# Patient Record
Sex: Female | Born: 1970 | Hispanic: Yes | Marital: Single | Smoking: Never smoker
Health system: Southern US, Community
[De-identification: ages and names within clinical notes are randomized; demographics above are authoritative.]

## PROBLEM LIST (undated history)

## (undated) DIAGNOSIS — E119 Type 2 diabetes mellitus without complications: Secondary | ICD-10-CM

## (undated) DIAGNOSIS — K219 Gastro-esophageal reflux disease without esophagitis: Secondary | ICD-10-CM

---

## 2005-05-05 ENCOUNTER — Emergency Department: Payer: Self-pay | Admitting: Emergency Medicine

## 2005-06-26 ENCOUNTER — Inpatient Hospital Stay: Payer: Self-pay | Admitting: Unknown Physician Specialty

## 2009-01-01 ENCOUNTER — Emergency Department: Payer: Self-pay | Admitting: Internal Medicine

## 2009-01-29 ENCOUNTER — Emergency Department: Payer: Self-pay | Admitting: Emergency Medicine

## 2009-03-28 ENCOUNTER — Emergency Department: Payer: Self-pay | Admitting: Emergency Medicine

## 2011-12-17 ENCOUNTER — Ambulatory Visit: Payer: Self-pay | Admitting: Obstetrics and Gynecology

## 2011-12-17 LAB — COMPREHENSIVE METABOLIC PANEL
Albumin: 3.7 g/dL (ref 3.4–5.0)
Alkaline Phosphatase: 105 U/L (ref 50–136)
Anion Gap: 12 (ref 7–16)
BUN: 11 mg/dL (ref 7–18)
Bilirubin,Total: 0.2 mg/dL (ref 0.2–1.0)
Calcium, Total: 8.7 mg/dL (ref 8.5–10.1)
Chloride: 105 mmol/L (ref 98–107)
Co2: 25 mmol/L (ref 21–32)
Creatinine: 0.6 mg/dL (ref 0.60–1.30)
EGFR (African American): >60
EGFR (Non-African Amer.): >60
Glucose: 148 mg/dL — ABNORMAL HIGH (ref 65–99)
Osmolality: 285 (ref 275–301)
Potassium: 4 mmol/L (ref 3.5–5.1)
SGOT(AST): 19 U/L (ref 15–37)
SGPT (ALT): 22 U/L
Sodium: 142 mmol/L (ref 136–145)
Total Protein: 7.8 g/dL (ref 6.4–8.2)

## 2011-12-17 LAB — CBC
HCT: 34.9 % — ABNORMAL LOW (ref 35.0–47.0)
HGB: 10.8 g/dL — ABNORMAL LOW (ref 12.0–16.0)
MCH: 22.5 pg — ABNORMAL LOW (ref 26.0–34.0)
MCHC: 31.1 g/dL — ABNORMAL LOW (ref 32.0–36.0)
MCV: 73 fL — ABNORMAL LOW (ref 80–100)
Platelet: 403 10*3/uL (ref 150–440)
RBC: 4.81 10*6/uL (ref 3.80–5.20)
RDW: 16 % — ABNORMAL HIGH (ref 11.5–14.5)
WBC: 7.1 10*3/uL (ref 3.6–11.0)

## 2011-12-27 ENCOUNTER — Ambulatory Visit: Payer: Self-pay | Admitting: Obstetrics and Gynecology

## 2011-12-27 LAB — PREGNANCY, URINE: Pregnancy Test, Urine: NEGATIVE m[IU]/mL

## 2011-12-31 LAB — PATHOLOGY REPORT

## 2013-11-05 ENCOUNTER — Ambulatory Visit: Payer: Self-pay | Admitting: Family Medicine

## 2013-12-11 ENCOUNTER — Ambulatory Visit: Payer: Self-pay | Admitting: Family Medicine

## 2014-05-14 ENCOUNTER — Emergency Department: Payer: Self-pay | Admitting: Emergency Medicine

## 2014-07-06 ENCOUNTER — Ambulatory Visit: Payer: Self-pay | Admitting: Unknown Physician Specialty

## 2015-01-23 NOTE — Op Note (Signed)
PATIENT NAME:  Betty Fernandez, Betty Fernandez MR#:  [id number] DATE OF BIRTH:  09/22/1972  DATE OF PROCEDURE:  12/27/2011  PREOPERATIVE DIAGNOSES:  1. Abnormal uterine bleeding.  2. Suspect uterine polyps.   POSTOPERATIVE DIAGNOSES: 1. Abnormal uterine bleeding.  2. Suspect uterine polyps.   PROCEDURES PERFORMED:  1. Hysteroscopy.  2. Dilation and curettage.  3. Polypectomy.   SURGEON: Prentice Docker, MD   ESTIMATED BLOOD LOSS: Minimal.   OPERATIVE FLUIDS: 1000 mL crystalloid.   COMPLICATIONS: None.   FINDINGS:  1. Three posterior uterine wall polyps.  2. Otherwise normal appearing uterine cavity.   SPECIMENS:  1. Endometrial curettings.  2. Endometrial polyps.   CONDITION: Stable at the end of the procedure.  INDICATION FOR PROCEDURE:  Ms. Fincher is a 44 year old female who presented to me in the clinic with abnormal uterine bleeding. Upon work-up there was suspicion for uterine polyps. After discussion of management options with the patient, she elected to go to the operating room  and undergo hysteroscopy, dilation and curettage, and polypectomy.   PROCEDURE IN DETAIL: After the patient was met in the preoperative area and after the procedure was reviewed, she was taken to the operating room. She was placed under general anesthesia, which was found to be adequate. She was prepped and draped in the usual sterile fashion in the dorsal supine lithotomy position in high lithotomy.   After time-out was called, a sterile speculum was placed in the vagina and the anterior lip of the cervix was grasped with a single-tooth tenaculum. The uterus was sound to approximately 9 cm. The cervix was gently dilated in a serial fashion using Hegar dilators to a dilation of 8 mm. The operative hysteroscope was then gently inserted through the cervix with the above-noted findings. The hysteroscope was then removed and three passes with first a serrated curette followed by two passes of a non-serrated  curette were performed until a gritty texture was noted throughout the entire uterus. The scope was reintroduced through the cervix to verify that the polyps previously identified had been removed. At this point the procedure was terminated. The tenaculum was removed from the anterior lip of the cervix and the speculum was removed from the vagina.   The patient tolerated the procedure well. Sponge, lap, and instrument counts were all correct times two. For VTE prophylaxis the patient was wearing pneumatic compression stockings throughout the entire case. She was awakened in the operating room and transferred to the PACU in stable condition.    ____________________________ Will Bonnet, MD sdj:bjt D: 12/27/2011 10:40:24 ET T: 12/27/2011 11:13:06 ET JOB#: [id number]  cc: Will Bonnet, MD, <Dictator> Will Bonnet MD ELECTRONICALLY SIGNED 12/27/2011 23:43

## 2015-10-10 ENCOUNTER — Ambulatory Visit: Payer: Self-pay | Admitting: *Deleted

## 2019-03-06 DIAGNOSIS — M1711 Unilateral primary osteoarthritis, right knee: Secondary | ICD-10-CM | POA: Diagnosis not present

## 2019-03-06 DIAGNOSIS — E119 Type 2 diabetes mellitus without complications: Secondary | ICD-10-CM | POA: Diagnosis not present

## 2019-03-06 DIAGNOSIS — E669 Obesity, unspecified: Secondary | ICD-10-CM | POA: Diagnosis not present

## 2019-03-06 DIAGNOSIS — M1712 Unilateral primary osteoarthritis, left knee: Secondary | ICD-10-CM | POA: Diagnosis not present

## 2019-04-29 DIAGNOSIS — E669 Obesity, unspecified: Secondary | ICD-10-CM | POA: Diagnosis not present

## 2019-04-29 DIAGNOSIS — M1712 Unilateral primary osteoarthritis, left knee: Secondary | ICD-10-CM | POA: Diagnosis not present

## 2019-04-29 DIAGNOSIS — E119 Type 2 diabetes mellitus without complications: Secondary | ICD-10-CM | POA: Diagnosis not present

## 2019-04-29 DIAGNOSIS — M1711 Unilateral primary osteoarthritis, right knee: Secondary | ICD-10-CM | POA: Diagnosis not present

## 2019-05-20 DIAGNOSIS — E119 Type 2 diabetes mellitus without complications: Secondary | ICD-10-CM | POA: Diagnosis not present

## 2019-05-20 DIAGNOSIS — M1712 Unilateral primary osteoarthritis, left knee: Secondary | ICD-10-CM | POA: Diagnosis not present

## 2019-05-20 DIAGNOSIS — E669 Obesity, unspecified: Secondary | ICD-10-CM | POA: Diagnosis not present

## 2019-07-17 DIAGNOSIS — R300 Dysuria: Secondary | ICD-10-CM | POA: Diagnosis not present

## 2019-07-17 DIAGNOSIS — R310 Gross hematuria: Secondary | ICD-10-CM | POA: Diagnosis not present

## 2019-07-17 DIAGNOSIS — B373 Candidiasis of vulva and vagina: Secondary | ICD-10-CM | POA: Diagnosis not present

## 2019-07-17 DIAGNOSIS — N390 Urinary tract infection, site not specified: Secondary | ICD-10-CM | POA: Diagnosis not present

## 2019-07-31 DIAGNOSIS — Z23 Encounter for immunization: Secondary | ICD-10-CM | POA: Diagnosis not present

## 2019-07-31 DIAGNOSIS — Z1321 Encounter for screening for nutritional disorder: Secondary | ICD-10-CM | POA: Diagnosis not present

## 2019-07-31 DIAGNOSIS — E119 Type 2 diabetes mellitus without complications: Secondary | ICD-10-CM | POA: Diagnosis not present

## 2019-07-31 DIAGNOSIS — Z7189 Other specified counseling: Secondary | ICD-10-CM | POA: Diagnosis not present

## 2019-07-31 DIAGNOSIS — I10 Essential (primary) hypertension: Secondary | ICD-10-CM | POA: Diagnosis not present

## 2019-07-31 DIAGNOSIS — K219 Gastro-esophageal reflux disease without esophagitis: Secondary | ICD-10-CM | POA: Diagnosis not present

## 2019-07-31 DIAGNOSIS — Z0000 Encounter for general adult medical examination without abnormal findings: Secondary | ICD-10-CM | POA: Diagnosis not present

## 2019-07-31 DIAGNOSIS — E1169 Type 2 diabetes mellitus with other specified complication: Secondary | ICD-10-CM | POA: Diagnosis not present

## 2019-07-31 DIAGNOSIS — M25511 Pain in right shoulder: Secondary | ICD-10-CM | POA: Diagnosis not present

## 2019-08-03 ENCOUNTER — Other Ambulatory Visit: Payer: Self-pay | Admitting: Gerontology

## 2019-08-03 DIAGNOSIS — Z1231 Encounter for screening mammogram for malignant neoplasm of breast: Secondary | ICD-10-CM

## 2019-08-08 DIAGNOSIS — U071 COVID-19: Secondary | ICD-10-CM | POA: Diagnosis not present

## 2019-08-08 DIAGNOSIS — Z20828 Contact with and (suspected) exposure to other viral communicable diseases: Secondary | ICD-10-CM | POA: Diagnosis not present

## 2019-09-10 DIAGNOSIS — Z01411 Encounter for gynecological examination (general) (routine) with abnormal findings: Secondary | ICD-10-CM | POA: Diagnosis not present

## 2019-09-10 DIAGNOSIS — Z124 Encounter for screening for malignant neoplasm of cervix: Secondary | ICD-10-CM | POA: Diagnosis not present

## 2019-09-10 DIAGNOSIS — L239 Allergic contact dermatitis, unspecified cause: Secondary | ICD-10-CM | POA: Diagnosis not present

## 2019-09-10 DIAGNOSIS — Z1231 Encounter for screening mammogram for malignant neoplasm of breast: Secondary | ICD-10-CM | POA: Diagnosis not present

## 2019-09-17 ENCOUNTER — Other Ambulatory Visit: Payer: Self-pay | Admitting: Obstetrics & Gynecology

## 2019-09-17 DIAGNOSIS — Z1231 Encounter for screening mammogram for malignant neoplasm of breast: Secondary | ICD-10-CM

## 2019-09-29 ENCOUNTER — Ambulatory Visit
Admission: RE | Admit: 2019-09-29 | Discharge: 2019-09-29 | Disposition: A | Discharge status: Home / Self Care | Payer: BC Managed Care – PPO | Source: Ambulatory Visit | Attending: Obstetrics & Gynecology | Admitting: Obstetrics & Gynecology

## 2019-09-29 DIAGNOSIS — Z1231 Encounter for screening mammogram for malignant neoplasm of breast: Secondary | ICD-10-CM | POA: Insufficient documentation

## 2019-10-02 HISTORY — PX: ELBOW SURGERY: SHX618

## 2019-10-14 DIAGNOSIS — M1711 Unilateral primary osteoarthritis, right knee: Secondary | ICD-10-CM | POA: Diagnosis not present

## 2019-10-14 DIAGNOSIS — E669 Obesity, unspecified: Secondary | ICD-10-CM | POA: Diagnosis not present

## 2019-10-14 DIAGNOSIS — M25561 Pain in right knee: Secondary | ICD-10-CM | POA: Diagnosis not present

## 2019-10-14 DIAGNOSIS — E119 Type 2 diabetes mellitus without complications: Secondary | ICD-10-CM | POA: Diagnosis not present

## 2019-10-19 DIAGNOSIS — M1711 Unilateral primary osteoarthritis, right knee: Secondary | ICD-10-CM | POA: Diagnosis not present

## 2019-10-26 DIAGNOSIS — M170 Bilateral primary osteoarthritis of knee: Secondary | ICD-10-CM | POA: Diagnosis not present

## 2019-11-03 DIAGNOSIS — E119 Type 2 diabetes mellitus without complications: Secondary | ICD-10-CM | POA: Diagnosis not present

## 2019-11-05 DIAGNOSIS — M170 Bilateral primary osteoarthritis of knee: Secondary | ICD-10-CM | POA: Diagnosis not present

## 2019-11-12 DIAGNOSIS — M1712 Unilateral primary osteoarthritis, left knee: Secondary | ICD-10-CM | POA: Diagnosis not present

## 2019-12-06 ENCOUNTER — Ambulatory Visit: Payer: BC Managed Care – PPO | Attending: Internal Medicine

## 2019-12-06 DIAGNOSIS — Z23 Encounter for immunization: Secondary | ICD-10-CM | POA: Insufficient documentation

## 2019-12-06 NOTE — Progress Notes (Signed)
   Covid-19 Vaccination Clinic  Name:  Betty Fernandez    MRN: [id number] DOB: 11-23-1970  12/06/2019  [name] was observed post Covid-19 immunization for 15 minutes without incident. She was provided with Vaccine Information Sheet and instruction to access the V-Safe system.   [name] was instructed to call 911 with any severe reactions post vaccine: Marland Kitchen Difficulty breathing  . Swelling of face and throat  . A fast heartbeat  . A bad rash all over body  . Dizziness and weakness   Immunizations Administered    Name Date Dose VIS Date Route   Pfizer COVID-19 Vaccine 12/06/2019  3:30 PM 0.3 mL 09/11/2019 Intramuscular   Manufacturer: [employer], [employer]   Lot: [id number]   NDC: [address]-2

## 2019-12-29 ENCOUNTER — Ambulatory Visit: Payer: BC Managed Care – PPO | Attending: Internal Medicine

## 2019-12-29 DIAGNOSIS — Z23 Encounter for immunization: Secondary | ICD-10-CM

## 2019-12-29 NOTE — Progress Notes (Signed)
   Covid-19 Vaccination Clinic  Name:  Betty Fernandez    MRN: [id number] DOB: 16-Aug-1971  12/29/2019  [name] was observed post Covid-19 immunization for 15 minutes without incident. She was provided with Vaccine Information Sheet and instruction to access the V-Safe system.   [name] was instructed to call 911 with any severe reactions post vaccine: Marland Kitchen Difficulty breathing  . Swelling of face and throat  . A fast heartbeat  . A bad rash all over body  . Dizziness and weakness   Immunizations Administered    Name Date Dose VIS Date Route   Pfizer COVID-19 Vaccine 12/29/2019 10:14 AM 0.3 mL 09/11/2019 Intramuscular   Manufacturer: [employer], [employer]   Lot: [id number]   NDC: [id number]

## 2020-01-12 DIAGNOSIS — Z01411 Encounter for gynecological examination (general) (routine) with abnormal findings: Secondary | ICD-10-CM | POA: Diagnosis not present

## 2020-03-15 DIAGNOSIS — E1169 Type 2 diabetes mellitus with other specified complication: Secondary | ICD-10-CM | POA: Diagnosis not present

## 2020-03-15 DIAGNOSIS — N951 Menopausal and female climacteric states: Secondary | ICD-10-CM | POA: Diagnosis not present

## 2020-03-15 DIAGNOSIS — N83201 Unspecified ovarian cyst, right side: Secondary | ICD-10-CM | POA: Diagnosis not present

## 2020-03-15 DIAGNOSIS — L239 Allergic contact dermatitis, unspecified cause: Secondary | ICD-10-CM | POA: Diagnosis not present

## 2020-06-27 ENCOUNTER — Ambulatory Visit
Admission: EM | Admit: 2020-06-27 | Discharge: 2020-06-27 | Disposition: A | Discharge status: Home / Self Care | Payer: BC Managed Care – PPO | Attending: Internal Medicine | Admitting: Internal Medicine

## 2020-06-27 ENCOUNTER — Encounter: Payer: Self-pay | Admitting: Emergency Medicine

## 2020-06-27 ENCOUNTER — Other Ambulatory Visit: Payer: Self-pay

## 2020-06-27 DIAGNOSIS — N760 Acute vaginitis: Secondary | ICD-10-CM

## 2020-06-27 HISTORY — DX: Gastro-esophageal reflux disease without esophagitis: K21.9

## 2020-06-27 HISTORY — DX: Type 2 diabetes mellitus without complications: E11.9

## 2020-06-27 LAB — CHLAMYDIA/NGC RT PCR (ARMC ONLY)
Chlamydia Tr: NOT DETECTED
N gonorrhoeae: NOT DETECTED

## 2020-06-27 LAB — URINALYSIS, COMPLETE (UACMP) WITH MICROSCOPIC
Bacteria, UA: NONE SEEN
Bilirubin Urine: NEGATIVE
Glucose, UA: >1000 mg/dL — AB
Ketones, ur: NEGATIVE mg/dL
Nitrite: NEGATIVE
Protein, ur: NEGATIVE mg/dL
Specific Gravity, Urine: 1.01 (ref 1.005–1.030)
pH: 6.5 (ref 5.0–8.0)

## 2020-06-27 LAB — WET PREP, GENITAL
Sperm: NONE SEEN
Trich, Wet Prep: NONE SEEN

## 2020-06-27 MED ORDER — METRONIDAZOLE 1.3 % VA GEL: VAGINAL | 0 refills | Status: DC

## 2020-06-27 MED ORDER — FLUCONAZOLE 150 MG PO TABS: ORAL_TABLET | ORAL | 0 refills | Status: DC

## 2020-06-27 NOTE — ED Provider Notes (Signed)
[facility]    CSN: [id number] Arrival date & time: 06/27/20  1704      History   Chief Complaint Chief Complaint  Patient presents with  . Vaginal Itching  . vaginal bump    HPI Betty Fernandez is a 49 y.o. female who presents with complaints of vaginal bumps x 6 months. They drain on their own and has a look of cottage cheese. She also been having vaginal itching off and on for months,  and dysuria and frequency x 1 week. Has tried OTC vaginal creams but has helped the itching but not the irritation and burning of the labia.  She is a diabetic and her glucose have been in the low 200's   Past Medical History:  Diagnosis Date  . Diabetes mellitus without complication (HCC)   . GERD (gastroesophageal reflux disease)     There are no problems to display for this patient.   History reviewed. No pertinent surgical history.  OB History   No obstetric history on file.      Home Medications    Prior to Admission medications   Medication Sig Start Date End Date Taking? Authorizing Provider  Canagliflozin-metFORMIN HCl (INVOKAMET) 50-1000 MG TABS Take 1 tablet by mouth 2 (two) times daily. 08/04/19  Yes [provider]  omeprazole (PRILOSEC) 20 MG capsule Take 20 mg by mouth daily. 03/07/20  Yes [provider]    Family History Family History  Problem Relation Age of Onset  . Diabetes Mother   . Healthy Father     Social History Social History   Tobacco Use  . Smoking status: Never Smoker  . Smokeless tobacco: Never Used  Vaping Use  . Vaping Use: Never used  Substance Use Topics  . Alcohol use: Never  . Drug use: Never     Allergies   Patient has no known allergies.   Review of Systems Review of Systems  Endocrine: Positive for polydipsia and polyuria.  Genitourinary: Positive for dysuria, frequency, vaginal discharge and vaginal pain. Negative for pelvic pain, urgency and vaginal bleeding.  Musculoskeletal: Negative  for gait problem.  Skin: Positive for rash.       + vaginal itching     Physical Exam Triage Vital Signs ED Triage Vitals  Enc Vitals Group     BP 06/27/20 1852 (!) 145/88     Pulse Rate 06/27/20 1852 92     Resp 06/27/20 1852 18     Temp 06/27/20 1852 98.2 F (36.8 C)     Temp Source 06/27/20 1852 Oral     SpO2 06/27/20 1852 100 %     Weight 06/27/20 1854 200 lb (90.7 kg)     Height 06/27/20 1854 5\' 2"  (1.575 m)     Head Circumference --      Peak Flow --      Pain Score 06/27/20 1853 5     Pain Loc --      Pain Edu? --      Excl. in GC? --    No data found.  Updated Vital Signs BP (!) 145/88 (BP Location: Left Arm)   Pulse 92   Temp 98.2 F (36.8 C) (Oral)   Resp 18   Ht 5\' 2"  (1.575 m)   Wt 200 lb (90.7 kg)   LMP 05/30/2020 (Approximate)   SpO2 100%   BMI 36.58 kg/m   Visual Acuity Right Eye Distance:   Left Eye Distance:   Bilateral Distance:  Right Eye Near:   Left Eye Near:    Bilateral Near:     Physical Exam Vitals and nursing note reviewed.  Constitutional:      General: She is not in acute distress.    Appearance: She is obese. She is not toxic-appearing.  HENT:     Head: Normocephalic.     Right Ear: External ear normal.     Left Ear: External ear normal.  Eyes:     General: No scleral icterus.    Conjunctiva/sclera: Conjunctivae normal.  Pulmonary:     Effort: Pulmonary effort is normal.  Genitourinary:    Comments: Labia minora is macerated with multiple erosions and tender. Has one pea size superficial cystic like mass on the upper L labia majora and smaller one below it and on R mid inner labia majora. They are not tender Musculoskeletal:        General: Normal range of motion.     Cervical back: Neck supple.  Skin:    General: Skin is warm and dry.  Neurological:     Mental Status: She is alert and oriented to person, place, and time.     Gait: Gait normal.  Psychiatric:        Mood and Affect: Mood normal.        Behavior:  Behavior normal.        Thought Content: Thought content normal.        Judgment: Judgment normal.      UC Treatments / Results  Labs (all labs ordered are listed, but only abnormal results are displayed) Labs Reviewed  WET PREP, GENITAL  URINALYSIS, COMPLETE (UACMP) WITH MICROSCOPIC    EKG   Radiology No results found.  Procedures Procedures (including critical care time)  Medications Ordered in UC Medications - No data to display  Initial Impression / Assessment and Plan / UC Course  I have reviewed the triage vital signs and the nursing notes. Has vaginal yeast and BV and was placed on diflucan and Metrogel vaginal gel Advised to control her glucose better. If she does not improve, needs to FU with GYN and make sure she is checked for Lichen Planus.  We will inform her when the herpes and GC/Chlamydia tests are back.  Pertinent labs results that were available during my care of the patient were reviewed by me and considered in my medical decision making (see chart for details).   Final Clinical Impressions(s) / UC Diagnoses   Final diagnoses:  None   Discharge Instructions   None    ED Prescriptions    None     PDMP not reviewed this encounter.   Garey Ham, Cordelia Poche 06/27/20 2141

## 2020-06-27 NOTE — ED Triage Notes (Addendum)
Patient in today c/o vaginal bump x 6 months and vaginal itching x 1 week. Patient has tried OTC vaginal cream. Patient c/o urinary frequency and dysuria x 1 week.

## 2020-06-27 NOTE — Discharge Instructions (Addendum)
Necesitas controlar to [employer], cuando elevado, te have dar mas ongos.

## 2020-06-30 LAB — HSV CULTURE AND TYPING

## 2020-08-15 DIAGNOSIS — N898 Other specified noninflammatory disorders of vagina: Secondary | ICD-10-CM | POA: Diagnosis not present

## 2020-08-15 DIAGNOSIS — Z01419 Encounter for gynecological examination (general) (routine) without abnormal findings: Secondary | ICD-10-CM | POA: Diagnosis not present

## 2020-08-15 DIAGNOSIS — Z1329 Encounter for screening for other suspected endocrine disorder: Secondary | ICD-10-CM | POA: Diagnosis not present

## 2020-08-15 DIAGNOSIS — E1169 Type 2 diabetes mellitus with other specified complication: Secondary | ICD-10-CM | POA: Diagnosis not present

## 2020-08-15 DIAGNOSIS — E785 Hyperlipidemia, unspecified: Secondary | ICD-10-CM | POA: Diagnosis not present

## 2020-08-15 DIAGNOSIS — I10 Essential (primary) hypertension: Secondary | ICD-10-CM | POA: Diagnosis not present

## 2020-08-16 ENCOUNTER — Other Ambulatory Visit: Payer: Self-pay | Admitting: Gerontology

## 2020-08-16 DIAGNOSIS — Z1231 Encounter for screening mammogram for malignant neoplasm of breast: Secondary | ICD-10-CM

## 2020-10-23 ENCOUNTER — Other Ambulatory Visit: Payer: Self-pay

## 2020-10-23 ENCOUNTER — Emergency Department
Admission: EM | Admit: 2020-10-23 | Discharge: 2020-10-23 | Disposition: A | Discharge status: Home / Self Care | Payer: BC Managed Care – PPO | Attending: Emergency Medicine | Admitting: Emergency Medicine

## 2020-10-23 DIAGNOSIS — B373 Candidiasis of vulva and vagina: Secondary | ICD-10-CM | POA: Insufficient documentation

## 2020-10-23 DIAGNOSIS — Z7984 Long term (current) use of oral hypoglycemic drugs: Secondary | ICD-10-CM | POA: Insufficient documentation

## 2020-10-23 DIAGNOSIS — L292 Pruritus vulvae: Secondary | ICD-10-CM | POA: Diagnosis not present

## 2020-10-23 DIAGNOSIS — N7689 Other specified inflammation of vagina and vulva: Secondary | ICD-10-CM | POA: Diagnosis not present

## 2020-10-23 DIAGNOSIS — B3731 Acute candidiasis of vulva and vagina: Secondary | ICD-10-CM

## 2020-10-23 DIAGNOSIS — N9089 Other specified noninflammatory disorders of vulva and perineum: Secondary | ICD-10-CM | POA: Diagnosis not present

## 2020-10-23 DIAGNOSIS — Z79899 Other long term (current) drug therapy: Secondary | ICD-10-CM | POA: Diagnosis not present

## 2020-10-23 DIAGNOSIS — N898 Other specified noninflammatory disorders of vagina: Secondary | ICD-10-CM

## 2020-10-23 DIAGNOSIS — E119 Type 2 diabetes mellitus without complications: Secondary | ICD-10-CM | POA: Diagnosis not present

## 2020-10-23 LAB — URINALYSIS, COMPLETE (UACMP) WITH MICROSCOPIC
Bilirubin Urine: NEGATIVE
Glucose, UA: >=500 mg/dL — AB
Ketones, ur: NEGATIVE mg/dL
Nitrite: NEGATIVE
Protein, ur: NEGATIVE mg/dL
Specific Gravity, Urine: 1.03 (ref 1.005–1.030)
pH: 7 (ref 5.0–8.0)

## 2020-10-23 LAB — WET PREP, GENITAL
Clue Cells Wet Prep HPF POC: NONE SEEN
Sperm: NONE SEEN
Trich, Wet Prep: NONE SEEN
WBC, Wet Prep HPF POC: NONE SEEN

## 2020-10-23 LAB — POC URINE PREG, ED: Preg Test, Ur: NEGATIVE

## 2020-10-23 MED ORDER — FLUCONAZOLE 50 MG PO TABS
150.0000 mg | ORAL_TABLET | Freq: Once | ORAL | Status: AC
  Administered 2020-10-23: 150 mg via ORAL
  Filled 2020-10-23: qty 1

## 2020-10-23 MED ORDER — FLUCONAZOLE 150 MG PO TABS: ORAL_TABLET | ORAL | 0 refills | Status: DC

## 2020-10-23 MED ORDER — METRONIDAZOLE 500 MG PO TABS: 500.0000 mg | ORAL_TABLET | Freq: Three times a day (TID) | ORAL | 0 refills | Status: AC

## 2020-10-23 MED ORDER — METRONIDAZOLE 500 MG PO TABS
500.0000 mg | ORAL_TABLET | Freq: Once | ORAL | Status: AC
  Administered 2020-10-23: 500 mg via ORAL
  Filled 2020-10-23: qty 1

## 2020-10-23 NOTE — ED Provider Notes (Signed)
[facility] Emergency Department Provider Note  ____________________________________________   Event Date/Time   First MD Initiated Contact with Patient 10/23/20 1948     (approximate)  I have reviewed the triage vital signs and the nursing notes.   HISTORY  Chief Complaint Vaginal Itching  History obtained with the assistance of an interpreter  HPI Betty Fernandez is a 50 y.o. female who presents to the emergency department for vaginal itching, burning and urinary burning. Patient states that symptoms been present for greater than 6 months. Patient has seen multiple providers for this complaint and been prescribed creams without any improvement. Patient states that she has been told that this was initially chronic folliculitis and then was later told that it was fungal but that none of the creams prescribed have been beneficial. Patient denies any abdominal pain, nausea vomiting, vaginal discharge. No fevers. Denies any concern for risk of STDs and was recently tested for this last urgent care visit.    Of note, patient does have poorly controlled diabetes.     Past Medical History:  Diagnosis Date  . Diabetes mellitus without complication (HCC)   . GERD (gastroesophageal reflux disease)     There are no problems to display for this patient.   History reviewed. No pertinent surgical history.  Prior to Admission medications   Medication Sig Start Date End Date Taking? Authorizing Provider  fluconazole (DIFLUCAN) 150 MG tablet Take one tablet in 72 hrs 10/23/20  Yes Leonce Bale, Ruben Gottron, PA  metroNIDAZOLE (FLAGYL) 500 MG tablet Take 1 tablet (500 mg total) by mouth 3 (three) times daily for 7 days. 10/23/20 10/30/20 Yes Kathelyn Gombos, Ruben Gottron, PA  Canagliflozin-metFORMIN HCl (INVOKAMET) 50-1000 MG TABS Take 1 tablet by mouth 2 (two) times daily. 08/04/19   [provider]  metroNIDAZOLE 1.3 % GEL One applicator qhs x 5 nights for bacterial vaginitis  06/27/20   Rodriguez-Southworth, Nettie Elm, PA-C  omeprazole (PRILOSEC) 20 MG capsule Take 20 mg by mouth daily. 03/07/20   [provider]    Allergies Patient has no known allergies.  Family History  Problem Relation Age of Onset  . Diabetes Mother   . Healthy Father     Social History Social History   Tobacco Use  . Smoking status: Never Smoker  . Smokeless tobacco: Never Used  Vaping Use  . Vaping Use: Never used  Substance Use Topics  . Alcohol use: Never  . Drug use: Never    Review of Systems Constitutional: No fever/chills Eyes: No visual changes. ENT: No sore throat. Cardiovascular: Denies chest pain. Respiratory: Denies shortness of breath. Gastrointestinal: No abdominal pain.  No nausea, no vomiting.  No diarrhea.  No constipation. Genitourinary: + Vaginal itching, burning,+ urinary burning Musculoskeletal: Negative for back pain. Skin: Negative for rash. Neurological: Negative for headaches, focal weakness or numbness.   ____________________________________________   PHYSICAL EXAM:  VITAL SIGNS: ED Triage Vitals  Enc Vitals Group     BP 10/23/20 1804 (!) 183/103     Pulse Rate 10/23/20 1804 93     Resp 10/23/20 1804 18     Temp 10/23/20 1807 98.2 F (36.8 C)     Temp Source 10/23/20 1807 Oral     SpO2 10/23/20 1804 100 %     Weight 10/23/20 1805 198 lb 6.6 oz (90 kg)     Height 10/23/20 1805 5\' 2"  (1.575 m)     Head Circumference --      Peak Flow --  Pain Score 10/23/20 1804 9     Pain Loc --      Pain Edu? --      Excl. in GC? --     Constitutional: Alert and oriented. Well appearing and in no acute distress. Eyes: Conjunctivae are normal. PERRL. EOMI. Head: Atraumatic. Nose: No congestion/rhinnorhea. Mouth/Throat: Mucous membranes are moist. Neck: No stridor.   Cardiovascular: Normal rate, regular rhythm. Grossly normal heart sounds.  Good peripheral circulation. Respiratory: Normal respiratory effort.  No retractions. Lungs  CTAB. Gastrointestinal: Soft and nontender. No distention. No abdominal bruits. No CVA tenderness. Genitourinary: The bilateral vulva are covered with erythematous, raw base with superficial white covering. Appears macerated in some locations with sores. Internal exam somewhat limited given to patient discomfort, though internal vaginal mucosa appeared normal. No palpable masses or lesion, no vaginal tenderness. Musculoskeletal: No lower extremity tenderness nor edema.  No joint effusions. Neurologic:  Normal speech and language. No gross focal neurologic deficits are appreciated. No gait instability. Skin:  Skin is warm, dry and intact. Vulvar irritation as described above. No rash noted. Psychiatric: Mood and affect are normal. Speech and behavior are normal.  ____________________________________________   LABS (all labs ordered are listed, but only abnormal results are displayed)  Labs Reviewed  WET PREP, GENITAL - Abnormal; Notable for the following components:      Result Value   Yeast Wet Prep HPF POC PRESENT (*)    All other components within normal limits  URINALYSIS, COMPLETE (UACMP) WITH MICROSCOPIC - Abnormal; Notable for the following components:   Color, Urine STRAW (*)    APPearance CLEAR (*)    Glucose, UA >=500 (*)    Hgb urine dipstick MODERATE (*)    Leukocytes,Ua SMALL (*)    Bacteria, UA RARE (*)    All other components within normal limits  URINE CULTURE  POC URINE PREG, ED    ____________________________________________   INITIAL IMPRESSION / ASSESSMENT AND PLAN / ED COURSE  As part of my medical decision making, I reviewed the following data within the electronic MEDICAL RECORD NUMBER Nursing notes reviewed and incorporated, Interpreter needed, Labs reviewed and Notes from prior ED visits        Patient is a 50 year old female who presents to the emergency department for evaluation of significant vaginal itching, burning and burning when she pees. See HPI for  further details. In triage, patient's vital signs were grossly normal except for some hypertension present. On physical exam, the vulva is covered in both sides with a grossly erythematous base with white scattered coating. There appear to be areas of maceration/beginning of ulceration. This is grossly tender for the patient and burns with touching. Labs obtained include urinalysis which demonstrates a moderate amount of hemoglobin, small number of leukocytes and rare bacteria. This will be sent for culture. Wet prep is positive for yeast, but no clue cells, white cells or other abnormal findings. Given the exam appearance and lab findings, differentials considered include severe yeast infection, lichen planus, or other vulvovaginal skin irritation.  Review of the patient's chart reveals that she has been evaluated at least twice in the last 6 months and treated for BV, yeast and also given topical bacitracin. Given that the patient has not had much success with these treatments thus far, I am concerned that yeast may not be the only cause of the patient's symptoms. However, patient is high risk for complicated yeast infections given her uncontrolled diabetes. We will begin treatment with Flagyl and Diflucan, though  cautioned the patient that this is likely not definitive treatment for her and she needs further evaluation by OB/GYN. She states that she has already been referred to dermatology for this but could not get an appointment for several months down the road. She will make an appointment with OB/GYN and begin with them to see if they can help to further characterize the rash. I am doubtful that the patient has a UTI, and the burning is likely due to the raw nature of the vulva being irritated. Patient is amenable with this plan and she is stable at this time for outpatient therapy.      ____________________________________________   FINAL CLINICAL IMPRESSION(S) / ED DIAGNOSES  Final diagnoses:   Vaginal itching  Vulvar irritation  Yeast vaginitis     ED Discharge Orders         Ordered    fluconazole (DIFLUCAN) 150 MG tablet        10/23/20 2129    metroNIDAZOLE (FLAGYL) 500 MG tablet  3 times daily        10/23/20 2129          *Please note:  MERIEL KELLIHER was evaluated in Emergency Department on 10/23/2020 for the symptoms described in the history of present illness. She was evaluated in the context of the global COVID-19 pandemic, which necessitated consideration that the patient might be at risk for infection with the SARS-CoV-2 virus that causes COVID-19. Institutional protocols and algorithms that pertain to the evaluation of patients at risk for COVID-19 are in a state of rapid change based on information released by regulatory bodies including the CDC and federal and state organizations. These policies and algorithms were followed during the patient's care in the ED.  Some ED evaluations and interventions may be delayed as a result of limited staffing during and the pandemic.*   Note:  This document was prepared using Dragon voice recognition software and may include unintentional dictation errors.   Lucy Chris, PA 10/23/20 2340    Sharman Cheek, MD 10/24/20 [phone]

## 2020-10-23 NOTE — ED Triage Notes (Signed)
Pt comes pov with vaginal itching, burning, and some vaginal bleeding. Interpreter used for triage. Has been dx with ingrown hairs.

## 2020-10-26 NOTE — Progress Notes (Signed)
Ward, [name], MD   Chief Complaint  Patient presents with  . Rash    HPI:      [name] is a 50 y.o. No obstetric history on file. whose LMP was Patient's last menstrual period was 10/26/2020., presents today for NP eval of chronic vaginal irritation for past 6 months. Has been treated with fungal meds without full relief, although has temporary improvement with tx. Went to ED 10/23/20 and treated with diflucan and flagyl for yeast and BV on wet prep; same tx at urgent care 9/21. Neg STD testing 9/21 and neg HSV culture. Has treated with OTC meds. Pt also has itchy bumps vaginally, getting larger and don't pop. No increased d/c or odor. She was using [place] soap vaginally and after sx persisted, has started using her shampoo to wash vaginally. No dryer sheets, wears cotton underwear. Hx of poorly controlled DM She is still having her periods.  Neg pap 2020  Past Medical History:  Diagnosis Date  . Diabetes mellitus without complication (HCC)   . GERD (gastroesophageal reflux disease)     Past Surgical History:  Procedure Laterality Date  . ELBOW SURGERY  2021    Family History  Problem Relation Age of Onset  . Diabetes Mother   . Healthy Father     Social History   Socioeconomic History  . Marital status: Single    Spouse name: Not on file  . Number of children: Not on file  . Years of education: Not on file  . Highest education level: Not on file  Occupational History  . Not on file  Tobacco Use  . Smoking status: Never Smoker  . Smokeless tobacco: Never Used  Vaping Use  . Vaping Use: Never used  Substance and Sexual Activity  . Alcohol use: Never  . Drug use: Never  . Sexual activity: Not on file  Other Topics Concern  . Not on file  Social History Narrative  . Not on file   Social Determinants of Health   Financial Resource Strain: Not on file  Food Insecurity: Not on file  Transportation Needs: Not on file  Physical Activity:  Not on file  Stress: Not on file  Social Connections: Not on file  Intimate Partner Violence: Not on file    Outpatient Medications Prior to Visit  Medication Sig Dispense Refill  . Canagliflozin-metFORMIN HCl (INVOKAMET) 50-1000 MG TABS Take 1 tablet by mouth 2 (two) times daily.    . fluconazole (DIFLUCAN) 150 MG tablet Take one tablet in 72 hrs 1 tablet 0  . metroNIDAZOLE (FLAGYL) 500 MG tablet Take 1 tablet (500 mg total) by mouth 3 (three) times daily for 7 days. 21 tablet 0  . metroNIDAZOLE 1.3 % GEL One applicator qhs x 5 nights for bacterial vaginitis 5 g 0  . omeprazole (PRILOSEC) 20 MG capsule Take 20 mg by mouth daily.     No facility-administered medications prior to visit.      ROS:  Review of Systems  Constitutional: Negative for fever, malaise/fatigue and weight loss.  Gastrointestinal: Negative for blood in stool, constipation, diarrhea, nausea and vomiting.  Genitourinary: Positive for vaginal pain. Negative for dyspareunia, dysuria, flank pain, frequency, hematuria, urgency, vaginal bleeding and vaginal discharge.  Musculoskeletal: Negative for back pain.  Skin: Negative for itching and rash.    OBJECTIVE:   Vitals:  BP (!) 142/78   Pulse 95   Temp 98.1 F (36.7 C)   Resp 16  Ht 5\' 3"  (1.6 m)   Wt 186 lb (84.4 kg)   LMP 10/26/2020   SpO2 99%   BMI 32.95 kg/m   Physical Exam Vitals reviewed.  Constitutional:      Appearance: She is well-developed.  Pulmonary:     Effort: Pulmonary effort is normal.  Genitourinary:    Pubic Area: No rash.      Labia:        Right: Rash, tenderness and lesion present.        Left: Rash, tenderness and lesion present.      Vagina: Bleeding present. No vaginal discharge, erythema or tenderness.     Cervix: Normal.     Uterus: Normal. Not enlarged and not tender.      Adnexa: Right adnexa normal and left adnexa normal.       Right: No mass or tenderness.         Left: No mass or tenderness.       Comments:  BILAT LABIA MAJORA WITH ERYTHEMA AND SWELLING; BILAT LABIA MINORA WITH ERYTHEMA, FISSURES; NO SKIN BREAKDOWN; HAS SEVERAL FIRM NODULES BILAT LABIA MAJORA THAT LOOK LIKE BLOCKED GLANDS; VERY TENDER TO TOUCH Musculoskeletal:        General: Normal range of motion.     Cervical back: Normal range of motion.  Skin:    General: Skin is warm and dry.  Neurological:     General: No focal deficit present.     Mental Status: She is alert and oriented to person, place, and time.  Psychiatric:        Mood and Affect: Mood normal.        Behavior: Behavior normal.        Thought Content: Thought content normal.        Judgment: Judgment normal.     Results: Results for orders placed or performed in visit on 10/27/20 (from the past 24 hour(s))  POCT Wet Prep with KOH     Status: Normal   Collection Time: 10/27/20  4:35 PM  Result Value Ref Range   Trichomonas, UA Negative    Clue Cells Wet Prep HPF POC neg    Epithelial Wet Prep HPF POC     Yeast Wet Prep HPF POC neg    Bacteria Wet Prep HPF POC     RBC Wet Prep HPF POC     WBC Wet Prep HPF POC     KOH Prep POC Negative Negative     Assessment/Plan: Chronic vaginitis - Plan: POCT Wet Prep with KOH, clotrimazole-betamethasone (LOTRISONE) cream; neg wet prep/pos exam. Looks c/w fungal derm. Rx lotrisone crm BID for 2 wks. RTO for f/u in 2 wks. Cool compresses, dove sens skin soap, improve DM control.    Meds ordered this encounter  Medications  . clotrimazole-betamethasone (LOTRISONE) cream    Sig: Apply externally BID for 2 wks    Dispense:  15 g    Refill:  0    Order Specific Question:   Supervising Provider    Answer:   10/29/20 Nadara Mustard      Return in about 2 weeks (around 11/10/2020) for vaginitis f/u.  Breianna Delfino B. Beth Spackman, PA-C 10/27/2020 4:36 PM

## 2020-10-27 ENCOUNTER — Encounter: Payer: Self-pay | Admitting: Obstetrics and Gynecology

## 2020-10-27 ENCOUNTER — Other Ambulatory Visit: Payer: Self-pay

## 2020-10-27 ENCOUNTER — Ambulatory Visit (INDEPENDENT_AMBULATORY_CARE_PROVIDER_SITE_OTHER): Payer: BC Managed Care – PPO | Admitting: Obstetrics and Gynecology

## 2020-10-27 VITALS — BP 142/78 | HR 95 | Temp 98.1°F | Resp 16 | Ht 63.0 in | Wt 186.0 lb

## 2020-10-27 DIAGNOSIS — N761 Subacute and chronic vaginitis: Secondary | ICD-10-CM | POA: Diagnosis not present

## 2020-10-27 LAB — POCT WET PREP WITH KOH
Clue Cells Wet Prep HPF POC: NEGATIVE
KOH Prep POC: NEGATIVE
Trichomonas, UA: NEGATIVE
Yeast Wet Prep HPF POC: NEGATIVE

## 2020-10-27 MED ORDER — CLOTRIMAZOLE-BETAMETHASONE 1-0.05 % EX CREA: TOPICAL_CREAM | CUTANEOUS | 0 refills | Status: DC

## 2020-10-27 NOTE — Patient Instructions (Signed)
I value your feedback and you entrusting us with your care. If you get a [facility] patient survey, I would appreciate you taking the time to let us know about your experience today. Thank you! ? ? ?

## 2020-11-10 ENCOUNTER — Ambulatory Visit (INDEPENDENT_AMBULATORY_CARE_PROVIDER_SITE_OTHER): Payer: BC Managed Care – PPO | Admitting: Obstetrics and Gynecology

## 2020-11-10 ENCOUNTER — Other Ambulatory Visit: Payer: Self-pay

## 2020-11-10 ENCOUNTER — Encounter: Payer: Self-pay | Admitting: Obstetrics and Gynecology

## 2020-11-10 VITALS — BP 100/70 | Ht 63.0 in | Wt 181.0 lb

## 2020-11-10 DIAGNOSIS — L738 Other specified follicular disorders: Secondary | ICD-10-CM

## 2020-11-10 DIAGNOSIS — N761 Subacute and chronic vaginitis: Secondary | ICD-10-CM

## 2020-11-10 MED ORDER — FLUCONAZOLE 150 MG PO TABS: ORAL_TABLET | ORAL | 1 refills | Status: DC

## 2020-11-10 NOTE — Patient Instructions (Signed)
I value your feedback and you entrusting us with your care. If you get a [facility] patient survey, I would appreciate you taking the time to let us know about your experience today. Thank you! ? ? ?

## 2020-11-10 NOTE — Progress Notes (Signed)
Ward, [name], MD   Chief Complaint  Patient presents with  . Vaginal Irritation    Itching, no discharge or odor following up from last visit    HPI:      [name] is a 50 y.o. No obstetric history on file. whose LMP was Patient's last menstrual period was 10/26/2020 (approximate)., presents today for for f/u chronic vaginitis due to fungal etiology from 10/27/20, treated BID with lotrisone crm for 2 wks. Sx much improved but still has a little itching, no increased d/c or odor. Would like refill of diflucan for itching and hx of yeast vag. DM not well controlled. Pt also with a few hard nodules on bilat labia majora that are still there.   LANGUAGE LINE USED FOR INTERPRETER   Past Medical History:  Diagnosis Date  . Diabetes mellitus without complication (HCC)   . GERD (gastroesophageal reflux disease)     Past Surgical History:  Procedure Laterality Date  . ELBOW SURGERY  2021    Family History  Problem Relation Age of Onset  . Diabetes Mother   . Healthy Father     Social History   Socioeconomic History  . Marital status: Single    Spouse name: Not on file  . Number of children: Not on file  . Years of education: Not on file  . Highest education level: Not on file  Occupational History  . Not on file  Tobacco Use  . Smoking status: Never Smoker  . Smokeless tobacco: Never Used  Vaping Use  . Vaping Use: Never used  Substance and Sexual Activity  . Alcohol use: Never  . Drug use: Never  . Sexual activity: Yes  Other Topics Concern  . Not on file  Social History Narrative  . Not on file   Social Determinants of Health   Financial Resource Strain: Not on file  Food Insecurity: Not on file  Transportation Needs: Not on file  Physical Activity: Not on file  Stress: Not on file  Social Connections: Not on file  Intimate Partner Violence: Not on file    Outpatient Medications Prior to Visit  Medication Sig Dispense Refill  .  Canagliflozin-metFORMIN HCl (INVOKAMET) 50-1000 MG TABS Take 1 tablet by mouth 2 (two) times daily.    . clotrimazole-betamethasone (LOTRISONE) cream Apply externally BID for 2 wks 15 g 0  . metroNIDAZOLE 1.3 % GEL One applicator qhs x 5 nights for bacterial vaginitis 5 g 0  . omeprazole (PRILOSEC) 20 MG capsule Take 20 mg by mouth daily.    . fluconazole (DIFLUCAN) 150 MG tablet Take one tablet in 72 hrs 1 tablet 0   No facility-administered medications prior to visit.      ROS:  Review of Systems  Constitutional: Negative for fever, malaise/fatigue and weight loss.  Gastrointestinal: Negative for blood in stool, constipation, diarrhea, nausea and vomiting.  Genitourinary: Positive for genital sores. Negative for dyspareunia, dysuria, flank pain, frequency, hematuria, urgency, vaginal bleeding, vaginal discharge and vaginal pain.  Musculoskeletal: Negative for back pain.  Skin: Negative for itching and rash.    OBJECTIVE:   Vitals:  BP 100/70   Ht 5\' 3"  (1.6 m)   Wt 181 lb (82.1 kg)   LMP 10/26/2020 (Approximate)   BMI 32.06 kg/m   Physical Exam Vitals reviewed.  Constitutional:      Appearance: She is well-developed.  Pulmonary:     Effort: Pulmonary effort is normal.  Genitourinary:    General:  Normal vulva.     Pubic Area: No rash.      Labia:        Right: Rash present. No tenderness or lesion.        Left: Rash present. No tenderness or lesion.      Vagina: Normal. No vaginal discharge, erythema or tenderness.     Cervix: Normal.     Uterus: Normal. Not enlarged and not tender.      Adnexa: Right adnexa normal and left adnexa normal.       Right: No mass or tenderness.         Left: No mass or tenderness.      Musculoskeletal:        General: Normal range of motion.     Cervical back: Normal range of motion.  Skin:    General: Skin is warm and dry.  Neurological:     General: No focal deficit present.     Mental Status: She is alert and oriented to  person, place, and time.  Psychiatric:        Mood and Affect: Mood normal.        Behavior: Behavior normal.        Thought Content: Thought content normal.        Judgment: Judgment normal.     Assessment/Plan: Chronic vaginitis - Plan: fluconazole (DIFLUCAN) 150 MG tablet; sx improved but not fully resolved. Rx diflucan for yeast vag sx, f/u prn. Encouraged DM control to prevent sx.   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 Reassurance. Could be due to chronic irritation/excoriation. May resolve or stay. F/u prn.    Meds ordered this encounter  Medications  . fluconazole (DIFLUCAN) 150 MG tablet    Sig: Take one tablet every 3 days for 3 doses    Dispense:  3 tablet    Refill:  1    Order Specific Question:   Supervising Provider    Answer:   Nadara Mustard [[id number]]      Return if symptoms worsen or fail to improve.  Chandra Asher B. Cyntia Staley, PA-C 11/10/2020 4:07 PM

## 2020-11-29 ENCOUNTER — Other Ambulatory Visit: Payer: Self-pay

## 2020-11-29 ENCOUNTER — Ambulatory Visit (INDEPENDENT_AMBULATORY_CARE_PROVIDER_SITE_OTHER): Payer: BC Managed Care – PPO | Admitting: Obstetrics and Gynecology

## 2020-11-29 ENCOUNTER — Encounter: Payer: Self-pay | Admitting: Obstetrics and Gynecology

## 2020-11-29 VITALS — BP 144/90 | Ht 63.0 in | Wt 185.0 lb

## 2020-11-29 DIAGNOSIS — N761 Subacute and chronic vaginitis: Secondary | ICD-10-CM | POA: Diagnosis not present

## 2020-11-29 DIAGNOSIS — L738 Other specified follicular disorders: Secondary | ICD-10-CM | POA: Diagnosis not present

## 2020-11-29 MED ORDER — FLUCONAZOLE 150 MG PO TABS: ORAL_TABLET | ORAL | 0 refills | Status: DC

## 2020-11-29 NOTE — Patient Instructions (Signed)
I value your feedback and you entrusting us with your care. If you get a [facility] patient survey, I would appreciate you taking the time to let us know about your experience today. Thank you! ? ? ?

## 2020-11-29 NOTE — Progress Notes (Signed)
Ward, [name], MD   Chief Complaint  Patient presents with  . Follow-up    Bumps in vaginal area itch a lot    HPI:      [name] is a 50 y.o. G1P1001 whose LMP was No LMP recorded., presents today for f/u on chronic vaginitis due to untreated yeast infection sx for several months. Was seen in ED 10/23/20 and treated with diflucan and flagyl for yeast and BV on wet prep; treated with same regimen 9/21. Pt sx slightly improved but persisted, and I saw pt on 10/27/20. Pt had severe ext vulvar fungal infection so treated with lotrisone crm BID for 2 wk.  Sx improved but not resolved. Saw pt again 11/10/20 with some sx improvement but still with erythema, fissures, skin breakdown. Treated with diflucan for 3 doses. Pt RTO today for f/u.  Hx of DM.   Vaginal itching improved but still complains of itchy bumps vaginally, which has also been a complaint since seen in ED 1/22. No increased d/c, odor currently. Diagnosed with sebaceous gland hyperplasia by me on exam 10/27/20. Pt has changed to unscented soap vaginally. Has tried cool compresses for itch with some relief. Pt wants RF flagyl because she states that helped with the itching before.   Has derm appt 01/03/21.   Past Medical History:  Diagnosis Date  . Diabetes mellitus without complication (HCC)   . GERD (gastroesophageal reflux disease)     Past Surgical History:  Procedure Laterality Date  . ELBOW SURGERY  2021    Family History  Problem Relation Age of Onset  . Diabetes Mother   . Healthy Father     Social History   Socioeconomic History  . Marital status: Single    Spouse name: Not on file  . Number of children: Not on file  . Years of education: Not on file  . Highest education level: Not on file  Occupational History  . Not on file  Tobacco Use  . Smoking status: Never Smoker  . Smokeless tobacco: Never Used  Vaping Use  . Vaping Use: Never used  Substance and Sexual Activity  . Alcohol use:  Never  . Drug use: Never  . Sexual activity: Yes  Other Topics Concern  . Not on file  Social History Narrative  . Not on file   Social Determinants of Health   Financial Resource Strain: Not on file  Food Insecurity: Not on file  Transportation Needs: Not on file  Physical Activity: Not on file  Stress: Not on file  Social Connections: Not on file  Intimate Partner Violence: Not on file    Outpatient Medications Prior to Visit  Medication Sig Dispense Refill  . Canagliflozin-metFORMIN HCl (INVOKAMET) 50-1000 MG TABS Take 1 tablet by mouth 2 (two) times daily. (Patient not taking: Reported on 11/29/2020)    . clotrimazole-betamethasone (LOTRISONE) cream Apply externally BID for 2 wks (Patient not taking: Reported on 11/29/2020) 15 g 0  . metroNIDAZOLE 1.3 % GEL One applicator qhs x 5 nights for bacterial vaginitis (Patient not taking: Reported on 11/29/2020) 5 g 0  . omeprazole (PRILOSEC) 20 MG capsule Take 20 mg by mouth daily. (Patient not taking: Reported on 11/29/2020)    . fluconazole (DIFLUCAN) 150 MG tablet Take one tablet every 3 days for 3 doses (Patient not taking: Reported on 11/29/2020) 3 tablet 1   No facility-administered medications prior to visit.      ROS:  Review of Systems  Constitutional: Negative for fever, malaise/fatigue and weight loss.  Gastrointestinal: Negative for blood in stool, constipation, diarrhea, nausea and vomiting.  Genitourinary: Negative for dyspareunia, dysuria, flank pain, frequency, hematuria, urgency, vaginal bleeding, vaginal discharge and vaginal pain.  Musculoskeletal: Negative for back pain.  Skin: Negative for itching and rash.    OBJECTIVE:   Vitals:  BP (!) 144/90   Ht 5\' 3"  (1.6 m)   Wt 185 lb (83.9 kg)   BMI 32.77 kg/m   Physical Exam Vitals reviewed.  Constitutional:      Appearance: She is well-developed.  Pulmonary:     Effort: Pulmonary effort is normal.  Genitourinary:    Labia:        Right: Lesion present.         Left: Lesion present.        Comments: VULVA WITH GOOD IMPROVEMENT ON EXAM COMPARED TO A COUPLE WKS AGO; STILL WITH SOME SLIGHT ERYTHEMA, SWELLING, FISSURES Musculoskeletal:        General: Normal range of motion.     Cervical back: Normal range of motion.  Skin:    General: Skin is warm and dry.  Neurological:     General: No focal deficit present.     Mental Status: She is alert and oriented to person, place, and time.     Cranial Nerves: No cranial nerve deficit.  Psychiatric:        Mood and Affect: Mood normal.        Behavior: Behavior normal.        Thought Content: Thought content normal.        Judgment: Judgment normal.     Assessment/Plan: Chronic vaginitis - Plan: fluconazole (DIFLUCAN) 150 MG tablet; sx improving but not fully resolved. Rx diflucan Q3 days for 3 doses, then once wkly as preventive.   Sebaceous gland hyperplasia of vulva--hard to know if vag itching is due to overall yeast vag vs itch/scratch vs other with hyperplasia since most pts aren't sx with these. Cool compresses/don't scratch, cont to treat with diflucan, has derm appt in 1 mo. If needs tx, they would be the ones to do it. F/u prn.    Meds ordered this encounter  Medications  . fluconazole (DIFLUCAN) 150 MG tablet    Sig: Take one tablet every 3 days for 3 doses, then once wkly as preventive for 3 months    Dispense:  17 tablet    Refill:  0    Order Specific Question:   Supervising Provider    Answer:   Nadara Mustard      Return if symptoms worsen or fail to improve.  Lisa-Marie Rueger B. Henryk Ursin, PA-C 11/29/2020 4:06 PM

## 2021-01-03 ENCOUNTER — Ambulatory Visit: Payer: BC Managed Care – PPO | Admitting: Dermatology

## 2021-01-03 ENCOUNTER — Other Ambulatory Visit: Payer: Self-pay

## 2021-01-03 DIAGNOSIS — B379 Candidiasis, unspecified: Secondary | ICD-10-CM | POA: Diagnosis not present

## 2021-01-03 DIAGNOSIS — N750 Cyst of Bartholin's gland: Secondary | ICD-10-CM

## 2021-01-03 MED ORDER — HYDROCORT-PRAMOXINE (PERIANAL) 2.5-1 % EX CREA: TOPICAL_CREAM | CUTANEOUS | 1 refills | Status: DC

## 2021-01-03 NOTE — Progress Notes (Signed)
   New Patient Visit  Subjective  Betty Fernandez is a 50 y.o. female who presents for the following: Skin Problem (Patient here for possible cysts in the vaginal area x 8 months. Some have burst and bled. Some are painful. She has used Lotrisone cream and Fluconazole pills in the past, improved with pills.).   The following portions of the chart were reviewed this encounter and updated as appropriate:       Review of Systems:  No other skin or systemic complaints except as noted in HPI or Assessment and Plan.  Objective  Well appearing patient in no apparent distress; mood and affect are within normal limits.  A focused examination was performed including groin, vaginal area. Relevant physical exam findings are noted in the Assessment and Plan.  Objective  Pubic: Firm white sub q nodules of the right labia majora 8.20mm, with surrounding smaller firm flesh papules on the upper labia majora R>L. Not inflamed today, not painful, but itchy. right perirectal 4.56mm firm papule c/w cyst;   Objective  vaginal area: Clear today   Assessment & Plan  Bartholin cyst Pubic  Improved- currently not inflamed, but still itchy per pt  Start Analpram-HC apply to AA groin TID prn itch dsp 30g 1Rf  If flare recurs, rec. Doxycycline 100 mg PO bid x 10 days, but would probably treat with Fluconazole PO at same time since h/o candidiasis. Recommend warm compresses BID prn return of symptoms (pain/swelling). May also require I&D.  Will send note to [employer], PA-C.  Would recommend surgical excision to larger cysts on labia by gynecologist if they continue to be symptomatic to completely remove.  hydrocortisone-pramoxine (ANALPRAM HC) 2.5-1 % rectal cream - Pubic  Candidiasis vaginal area  Much improved- Clear today Pt was treated with oral fluconazole with significant improvement. Do not recommend Lotrisone cream to inguinal creases or perirectal area due to risk of skin  atrophy.  Return if symptoms worsen or fail to improve.   Documentation: I have reviewed the above documentation for accuracy and completeness, and I agree with the above.  Willeen Niece MD

## 2021-01-03 NOTE — Patient Instructions (Signed)

## 2021-01-05 ENCOUNTER — Telehealth: Payer: Self-pay

## 2021-01-05 MED ORDER — HYDROCORTISONE 2.5 % EX OINT: TOPICAL_OINTMENT | CUTANEOUS | 1 refills | Status: DC

## 2021-01-05 NOTE — Telephone Encounter (Signed)
Can send in generic HC 2.5% ointment qd/bid prn itch groin, 60 gm, 1 rf

## 2021-01-05 NOTE — Telephone Encounter (Signed)
The type of Hydrocortisone sent in at last visit is not covered/plan exclusion for [employer].

## 2021-01-05 NOTE — Telephone Encounter (Signed)
Hydrocortisone 2.5% Ointment sent in.

## 2021-02-07 DIAGNOSIS — E785 Hyperlipidemia, unspecified: Secondary | ICD-10-CM | POA: Diagnosis not present

## 2021-02-07 DIAGNOSIS — K219 Gastro-esophageal reflux disease without esophagitis: Secondary | ICD-10-CM | POA: Diagnosis not present

## 2021-02-07 DIAGNOSIS — I10 Essential (primary) hypertension: Secondary | ICD-10-CM | POA: Diagnosis not present

## 2021-02-07 DIAGNOSIS — M1711 Unilateral primary osteoarthritis, right knee: Secondary | ICD-10-CM | POA: Diagnosis not present

## 2021-02-07 DIAGNOSIS — E1169 Type 2 diabetes mellitus with other specified complication: Secondary | ICD-10-CM | POA: Diagnosis not present

## 2021-02-22 DIAGNOSIS — E669 Obesity, unspecified: Secondary | ICD-10-CM | POA: Diagnosis not present

## 2021-02-22 DIAGNOSIS — M1712 Unilateral primary osteoarthritis, left knee: Secondary | ICD-10-CM | POA: Diagnosis not present

## 2021-02-22 DIAGNOSIS — M1711 Unilateral primary osteoarthritis, right knee: Secondary | ICD-10-CM | POA: Diagnosis not present

## 2021-02-22 DIAGNOSIS — E119 Type 2 diabetes mellitus without complications: Secondary | ICD-10-CM | POA: Diagnosis not present

## 2021-03-07 ENCOUNTER — Ambulatory Visit (INDEPENDENT_AMBULATORY_CARE_PROVIDER_SITE_OTHER): Payer: BC Managed Care – PPO | Admitting: Obstetrics and Gynecology

## 2021-03-07 ENCOUNTER — Encounter: Payer: Self-pay | Admitting: Obstetrics and Gynecology

## 2021-03-07 ENCOUNTER — Other Ambulatory Visit: Payer: Self-pay

## 2021-03-07 VITALS — BP 140/80 | Ht 63.0 in | Wt 186.0 lb

## 2021-03-07 DIAGNOSIS — E11620 Type 2 diabetes mellitus with diabetic dermatitis: Secondary | ICD-10-CM | POA: Insufficient documentation

## 2021-03-07 DIAGNOSIS — R350 Frequency of micturition: Secondary | ICD-10-CM | POA: Diagnosis not present

## 2021-03-07 DIAGNOSIS — N761 Subacute and chronic vaginitis: Secondary | ICD-10-CM | POA: Diagnosis not present

## 2021-03-07 LAB — POCT URINALYSIS DIPSTICK
Bilirubin, UA: NEGATIVE
Glucose, UA: POSITIVE — AB
Leukocytes, UA: NEGATIVE
Nitrite, UA: NEGATIVE
Spec Grav, UA: 1.02 (ref 1.010–1.025)
pH, UA: 6 (ref 5.0–8.0)

## 2021-03-07 LAB — POCT WET PREP WITH KOH
Clue Cells Wet Prep HPF POC: NEGATIVE
KOH Prep POC: NEGATIVE
Trichomonas, UA: NEGATIVE
Yeast Wet Prep HPF POC: POSITIVE

## 2021-03-07 LAB — GLUCOSE, POCT (MANUAL RESULT ENTRY): POC Glucose: 520 mg/dl — AB (ref 70–99)

## 2021-03-07 MED ORDER — CLOTRIMAZOLE-BETAMETHASONE 1-0.05 % EX CREA: TOPICAL_CREAM | CUTANEOUS | 0 refills | Status: DC

## 2021-03-07 MED ORDER — FLUCONAZOLE 150 MG PO TABS: ORAL_TABLET | ORAL | 0 refills | Status: DC

## 2021-03-07 NOTE — Progress Notes (Signed)
Ward, [name], MD   Chief Complaint  Patient presents with  . Vaginal Rash    Itchiness and irritation, rash got better but did not go away completely  . Urinary Tract Infection    Frequency and burning urinating, blood in urine when wipes x 4 days    HPI:      [name] is a 50 y.o. G1P1001 whose LMP was Patient's last menstrual period was 02/04/2021 (exact date)., presents today for recurrent chronic vaginitis issues. Seen several times for yeast vag sx since 1/22; sx started ~6/21. Treated with diflucan and lotrisone crm. Most recently did diflucan once wkly since 4/22 and sx much improved. Sx have since recurred. Very itchy, with scratching, burning, urine feels very hot. Has noticed some blood with wiping and thinks from hard nodules. Also with urinary frequency and burning.   Hx of hard, itchy nodules vaginally that looked like seb gland hyperplasia. The smaller lesions have resolved and 1 larger one popped a few days ago. Pt thinks nodules are cause of itching and bleeding. Pt saw derm 4/22 and was told lesions may need to be surgically excised.  Given analpram by derm. Neg HSV testing 1/22.  Pt still has menses, late for period this month.  Hx of DM, not well controlled. Pt states has 1 injection to use nightly for DM. HgA1C=16.1 5/22  LANGUAGE LINE USED  Past Medical History:  Diagnosis Date  . Diabetes mellitus without complication (HCC)   . GERD (gastroesophageal reflux disease)     Past Surgical History:  Procedure Laterality Date  . ELBOW SURGERY  2021    Family History  Problem Relation Age of Onset  . Diabetes Mother   . Healthy Father     Social History   Socioeconomic History  . Marital status: Single    Spouse name: Not on file  . Number of children: Not on file  . Years of education: Not on file  . Highest education level: Not on file  Occupational History  . Not on file  Tobacco Use  . Smoking status: Never Smoker  . Smokeless  tobacco: Never Used  Vaping Use  . Vaping Use: Never used  Substance and Sexual Activity  . Alcohol use: Never  . Drug use: Never  . Sexual activity: Yes  Other Topics Concern  . Not on file  Social History Narrative  . Not on file   Social Determinants of Health   Financial Resource Strain: Not on file  Food Insecurity: Not on file  Transportation Needs: Not on file  Physical Activity: Not on file  Stress: Not on file  Social Connections: Not on file  Intimate Partner Violence: Not on file    Outpatient Medications Prior to Visit  Medication Sig Dispense Refill  . Canagliflozin-metFORMIN HCl (INVOKAMET) 50-1000 MG TABS Take 1 tablet by mouth 2 (two) times daily. (Patient not taking: Reported on 11/29/2020)    . hydrocortisone 2.5 % ointment Apply topically as directed. Apply once to twice daily as needed to itch. 28.35 g 1  . hydrocortisone-pramoxine (ANALPRAM HC) 2.5-1 % rectal cream Apply to affected areas groin three times daily as needed for itchy. 30 g 1  . metroNIDAZOLE 1.3 % GEL One applicator qhs x 5 nights for bacterial vaginitis (Patient not taking: Reported on 11/29/2020) 5 g 0  . omeprazole (PRILOSEC) 20 MG capsule Take 20 mg by mouth daily. (Patient not taking: Reported on 11/29/2020)    . clotrimazole-betamethasone (  LOTRISONE) cream Apply externally BID for 2 wks (Patient not taking: Reported on 11/29/2020) 15 g 0  . fluconazole (DIFLUCAN) 150 MG tablet Take one tablet every 3 days for 3 doses, then once wkly as preventive for 3 months 17 tablet 0   No facility-administered medications prior to visit.      ROS:  Review of Systems  Constitutional: Negative for fever.  Gastrointestinal: Negative for blood in stool, constipation, diarrhea, nausea and vomiting.  Genitourinary: Positive for dysuria, frequency, vaginal bleeding and vaginal pain. Negative for dyspareunia, flank pain, hematuria, urgency and vaginal discharge.  Musculoskeletal: Negative for back pain.   Skin: Negative for rash.   OBJECTIVE:   Vitals:  BP 140/80   Ht 5\' 3"  (1.6 m)   Wt 186 lb (84.4 kg)   LMP 02/04/2021 (Exact Date)   BMI 32.95 kg/m   Physical Exam Vitals reviewed.  Constitutional:      Appearance: She is well-developed.  Pulmonary:     Effort: Pulmonary effort is normal.  Genitourinary:    Pubic Area: No rash.      Labia:        Right: Rash, tenderness and lesion present.        Left: Rash, tenderness and lesion present.      Vagina: Bleeding present. No vaginal discharge, erythema or tenderness.     Cervix: Normal.     Uterus: Normal. Not enlarged and not tender.      Adnexa: Right adnexa normal and left adnexa normal.       Right: No mass or tenderness.         Left: No mass or tenderness.       Comments: BILAT LABIA MAJORA AND MINORA WITH SWELLING, ERYTHEMA, FISSURES AND EXCORIATIONS; NO ULCERATIVE LESIONS; NO EVID OF "HARD NODULES"; BLEEDING FROM CX/UTERUS Musculoskeletal:        General: Normal range of motion.     Cervical back: Normal range of motion.  Skin:    General: Skin is warm and dry.  Neurological:     General: No focal deficit present.     Mental Status: She is alert and oriented to person, place, and time.  Psychiatric:        Mood and Affect: Mood normal.        Behavior: Behavior normal.        Thought Content: Thought content normal.        Judgment: Judgment normal.     Results: Results for orders placed or performed in visit on 03/07/21 (from the past 24 hour(s))  POCT Wet Prep with KOH     Status: Normal   Collection Time: 03/07/21  4:35 PM  Result Value Ref Range   Trichomonas, UA Negative    Clue Cells Wet Prep HPF POC neg    Epithelial Wet Prep HPF POC     Yeast Wet Prep HPF POC pos    Bacteria Wet Prep HPF POC     RBC Wet Prep HPF POC     WBC Wet Prep HPF POC     KOH Prep POC Negative Negative  POCT Urinalysis Dipstick     Status: Abnormal   Collection Time: 03/07/21  4:36 PM  Result Value Ref Range   Color,  UA yellow    Clarity, UA clear    Glucose, UA Positive (A) Negative   Bilirubin, UA neg    Ketones, UA     Spec Grav, UA 1.020 1.010 - 1.025   Blood, UA  trace    pH, UA 6.0 5.0 - 8.0   Protein, UA     Urobilinogen, UA     Nitrite, UA neg    Leukocytes, UA Negative Negative   Appearance     Odor    POCT Glucose (CBG)     Status: Abnormal   Collection Time: 03/07/21  4:36 PM  Result Value Ref Range   POC Glucose 520 (A) 70 - 99 mg/dl  RANDOM GLUCOSE--LAST EATEN 5 HRS BEFORE GLUCOSE CHECK   Assessment/Plan: Chronic vaginitis - Plan: clotrimazole-betamethasone (LOTRISONE) cream, fluconazole (DIFLUCAN) 150 MG tablet, POCT Wet Prep with KOH; pos sx and exam. Explained to pt that uncontrolled DM cause of recurrent sx. Rx diflucan to take for 3 days then once wkly as maintenance for 6 months, as well as lotrisone crm ext to help with irritation. Sx will improve once DM controlled. No evidence of hard lesions. Reassurance.   Urinary frequency - Plan: POCT Urinalysis Dipstick; neg UA for UTI. Sx most likely due to polyuria and dysuria due to vaginitis.   Type 2 diabetes mellitus with diabetic dermatitis, without long-term current use of insulin (HCC) - Plan: POCT Glucose (CBG); uncontrolled. Pt to call PCP today to f/u re: DM mgmt.   Blood with wiping from starting menses.    Meds ordered this encounter  Medications  . clotrimazole-betamethasone (LOTRISONE) cream    Sig: Apply externally BID for 2 wks    Dispense:  45 g    Refill:  0    Order Specific Question:   Supervising Provider    Answer:   Nadara Mustard [id number]  . fluconazole (DIFLUCAN) 150 MG tablet    Sig: Take one tablet every 3 days for 3 doses, then once wkly as preventive for 6 months    Dispense:  30 tablet    Refill:  0    Order Specific Question:   Supervising Provider    Answer:   Nadara Mustard [[id number]]   LANGUAGE LINE USED.    Return if symptoms worsen or fail to improve.  Hermina Barnard B. Caleb Decock,  PA-C 03/07/2021 4:38 PM

## 2021-03-07 NOTE — Patient Instructions (Signed)
I value your feedback and you entrusting us with your care. If you get a [facility] patient survey, I would appreciate you taking the time to let us know about your experience today. Thank you! ? ? ?

## 2021-03-24 DIAGNOSIS — M170 Bilateral primary osteoarthritis of knee: Secondary | ICD-10-CM | POA: Diagnosis not present

## 2021-03-31 DIAGNOSIS — M170 Bilateral primary osteoarthritis of knee: Secondary | ICD-10-CM | POA: Diagnosis not present

## 2021-04-28 DIAGNOSIS — M170 Bilateral primary osteoarthritis of knee: Secondary | ICD-10-CM | POA: Diagnosis not present

## 2021-09-27 ENCOUNTER — Other Ambulatory Visit: Payer: Self-pay

## 2021-09-27 ENCOUNTER — Encounter: Payer: Self-pay | Admitting: Obstetrics and Gynecology

## 2021-09-27 ENCOUNTER — Ambulatory Visit: Payer: BC Managed Care – PPO | Admitting: Obstetrics and Gynecology

## 2021-09-27 VITALS — BP 130/70 | Ht 63.0 in | Wt 185.8 lb

## 2021-09-27 DIAGNOSIS — N761 Subacute and chronic vaginitis: Secondary | ICD-10-CM | POA: Diagnosis not present

## 2021-09-27 DIAGNOSIS — B3731 Acute candidiasis of vulva and vagina: Secondary | ICD-10-CM

## 2021-09-27 DIAGNOSIS — R875 Abnormal microbiological findings in specimens from female genital organs: Secondary | ICD-10-CM | POA: Diagnosis not present

## 2021-09-27 MED ORDER — FLUCONAZOLE 150 MG PO TABS: ORAL_TABLET | ORAL | 0 refills | Status: DC

## 2021-09-27 NOTE — Progress Notes (Signed)
Patient ID: Betty Fernandez, female   DOB: November 18, 1970, 50 y.o.   MRN: [id number]  Reason for Consult: Gynecologic Exam (Pt states she has a vaginal itch. She needs a refill on the medication she was using for it.)   Referred by Ward, Honor Loh, MD  Subjective:     HPI:  Betty Fernandez is a 50 y.o. female patient presents today with complaints of vaginal itching.  She reports that she has been taking Diflucan for chronic suppression of yeast infections.  She recently used over-the-counter medications for treatment.  She does not recall the name of the over-the-counter treatments.  She reports that they did not completely resolve her issue.  She feels especially pain in the upper labial area.  Patient has a history of very uncontrolled type 2 diabetes.  She reports that she follows with her primary care for this and does take insulin once a day.  Gynecological History  Patient's last menstrual period was 08/28/2021.  Past Medical History:  Diagnosis Date   Diabetes mellitus without complication (HCC)    GERD (gastroesophageal reflux disease)    Family History  Problem Relation Age of Onset   Diabetes Mother    Healthy Father    Past Surgical History:  Procedure Laterality Date   ELBOW SURGERY  2021    Short Social History:  Social History   Tobacco Use   Smoking status: Never   Smokeless tobacco: Never  Substance Use Topics   Alcohol use: Never    No Known Allergies  Current Outpatient Medications  Medication Sig Dispense Refill   clotrimazole-betamethasone (LOTRISONE) cream Apply externally BID for 2 wks 45 g 0   fluconazole (DIFLUCAN) 150 MG tablet Take one tablet every 3 days for 3 doses, then once wkly as preventive for 6 months 30 tablet 0   hydrocortisone 2.5 % ointment Apply topically as directed. Apply once to twice daily as needed to itch. 28.35 g 1   hydrocortisone-pramoxine (ANALPRAM HC) 2.5-1 % rectal cream Apply to affected areas groin three times  daily as needed for itchy. 30 g 1   Canagliflozin-metFORMIN HCl (INVOKAMET) 50-1000 MG TABS Take 1 tablet by mouth 2 (two) times daily. (Patient not taking: Reported on 11/29/2020)     metroNIDAZOLE 1.3 % GEL One applicator qhs x 5 nights for bacterial vaginitis (Patient not taking: Reported on 11/29/2020) 5 g 0   omeprazole (PRILOSEC) 20 MG capsule Take 20 mg by mouth daily. (Patient not taking: Reported on 11/29/2020)     No current facility-administered medications for this visit.    Review of Systems  Constitutional: Negative for chills, fatigue, fever and unexpected weight change.  HENT: Negative for trouble swallowing.  Eyes: Negative for loss of vision.  Respiratory: Negative for cough, shortness of breath and wheezing.  Cardiovascular: Negative for chest pain, leg swelling, palpitations and syncope.  GI: Negative for abdominal pain, blood in stool, diarrhea, nausea and vomiting.  GU: Negative for difficulty urinating, dysuria, frequency and hematuria.  Musculoskeletal: Negative for back pain, leg pain and joint pain.  Skin: Negative for rash.  Neurological: Negative for dizziness, headaches, light-headedness, numbness and seizures.  Psychiatric: Negative for behavioral problem, confusion, depressed mood and sleep disturbance.       Objective:  Objective   Vitals:   09/27/21 0917  BP: 130/70  Weight: 185 lb 12.8 oz (84.3 kg)  Height: 5\' 3"  (1.6 m)   Body mass index is 32.91 kg/m.  Physical Exam Vitals and nursing note  reviewed. Exam conducted with a chaperone present.  Constitutional:      Appearance: Normal appearance. She is well-developed.  HENT:     Head: Normocephalic and atraumatic.  Eyes:     Extraocular Movements: Extraocular movements intact.     Pupils: Pupils are equal, round, and reactive to light.  Cardiovascular:     Rate and Rhythm: Normal rate and regular rhythm.  Pulmonary:     Effort: Pulmonary effort is normal. No respiratory distress.     Breath  sounds: Normal breath sounds.  Abdominal:     General: Abdomen is flat.     Palpations: Abdomen is soft.  Genitourinary:    Comments: External: Normal appearing vulva. No lesions noted.  Speculum examination: Normal appearing cervix. Scant blood in the vaginal vault- patient reports her menstrual cycle is starting . No discharge.   Musculoskeletal:        General: No signs of injury.  Skin:    General: Skin is warm and dry.  Neurological:     Mental Status: She is alert and oriented to person, place, and time.  Psychiatric:        Behavior: Behavior normal.        Thought Content: Thought content normal.        Judgment: Judgment normal.    Assessment/Plan:    50 yo with recurrent yeast vaginitis Encouraged better glucose control to combat recurrent yeast infection.  Discussed that the patient's yeast will likely continue if she does not address her underlying diabetes.  Encouraged the patient to schedule follow-up with her primary care physician which she reported she would. Discussed that long term fluconazole usage can cause liver problems and is not encouraged.Patient has had elevated AST/ALT in the past.  Refilled diflucan at patient request.   More than 15 minutes were spent face to face with the patient in the room, reviewing the medical record, labs and images, and coordinating care for the patient. The plan of management was discussed in detail and counseling was provided.     Adelene Idler MD [facility], [facility] 09/27/2021 10:16 AM

## 2021-09-30 LAB — NUSWAB BV AND CANDIDA, NAA
Atopobium vaginae: HIGH Score — AB
BVAB 2: HIGH Score — AB
Candida albicans, NAA: NEGATIVE
Candida glabrata, NAA: POSITIVE — AB
Megasphaera 1: HIGH Score — AB

## 2021-12-05 DIAGNOSIS — E669 Obesity, unspecified: Secondary | ICD-10-CM | POA: Diagnosis not present

## 2021-12-05 DIAGNOSIS — I83813 Varicose veins of bilateral lower extremities with pain: Secondary | ICD-10-CM | POA: Diagnosis not present

## 2021-12-05 DIAGNOSIS — Z0000 Encounter for general adult medical examination without abnormal findings: Secondary | ICD-10-CM | POA: Diagnosis not present

## 2021-12-05 DIAGNOSIS — I10 Essential (primary) hypertension: Secondary | ICD-10-CM | POA: Diagnosis not present

## 2021-12-05 DIAGNOSIS — Z23 Encounter for immunization: Secondary | ICD-10-CM | POA: Diagnosis not present

## 2021-12-07 DIAGNOSIS — E559 Vitamin D deficiency, unspecified: Secondary | ICD-10-CM | POA: Diagnosis not present

## 2021-12-07 DIAGNOSIS — E1169 Type 2 diabetes mellitus with other specified complication: Secondary | ICD-10-CM | POA: Diagnosis not present

## 2021-12-07 DIAGNOSIS — I10 Essential (primary) hypertension: Secondary | ICD-10-CM | POA: Diagnosis not present

## 2021-12-07 DIAGNOSIS — K219 Gastro-esophageal reflux disease without esophagitis: Secondary | ICD-10-CM | POA: Diagnosis not present

## 2021-12-07 DIAGNOSIS — E538 Deficiency of other specified B group vitamins: Secondary | ICD-10-CM | POA: Diagnosis not present

## 2021-12-07 DIAGNOSIS — Z1329 Encounter for screening for other suspected endocrine disorder: Secondary | ICD-10-CM | POA: Diagnosis not present

## 2021-12-07 DIAGNOSIS — E785 Hyperlipidemia, unspecified: Secondary | ICD-10-CM | POA: Diagnosis not present

## 2021-12-07 DIAGNOSIS — E8881 Metabolic syndrome: Secondary | ICD-10-CM | POA: Diagnosis not present

## 2021-12-31 DIAGNOSIS — Z7984 Long term (current) use of oral hypoglycemic drugs: Secondary | ICD-10-CM | POA: Diagnosis not present

## 2021-12-31 DIAGNOSIS — M542 Cervicalgia: Secondary | ICD-10-CM | POA: Diagnosis not present

## 2021-12-31 DIAGNOSIS — E119 Type 2 diabetes mellitus without complications: Secondary | ICD-10-CM | POA: Diagnosis not present

## 2021-12-31 DIAGNOSIS — M5012 Mid-cervical disc disorder, unspecified level: Secondary | ICD-10-CM | POA: Diagnosis not present

## 2021-12-31 DIAGNOSIS — M25511 Pain in right shoulder: Secondary | ICD-10-CM | POA: Diagnosis not present

## 2021-12-31 DIAGNOSIS — I672 Cerebral atherosclerosis: Secondary | ICD-10-CM | POA: Diagnosis not present

## 2021-12-31 DIAGNOSIS — R6883 Chills (without fever): Secondary | ICD-10-CM | POA: Diagnosis not present

## 2021-12-31 DIAGNOSIS — M5412 Radiculopathy, cervical region: Secondary | ICD-10-CM | POA: Diagnosis not present

## 2021-12-31 DIAGNOSIS — M79601 Pain in right arm: Secondary | ICD-10-CM | POA: Diagnosis not present

## 2021-12-31 DIAGNOSIS — M5032 Other cervical disc degeneration, mid-cervical region, unspecified level: Secondary | ICD-10-CM | POA: Diagnosis not present

## 2022-01-09 DIAGNOSIS — M5412 Radiculopathy, cervical region: Secondary | ICD-10-CM | POA: Diagnosis not present

## 2022-01-16 DIAGNOSIS — M5412 Radiculopathy, cervical region: Secondary | ICD-10-CM | POA: Diagnosis not present

## 2022-01-16 DIAGNOSIS — M5030 Other cervical disc degeneration, unspecified cervical region: Secondary | ICD-10-CM | POA: Diagnosis not present

## 2022-01-17 ENCOUNTER — Other Ambulatory Visit: Payer: Self-pay | Admitting: Family Medicine

## 2022-01-17 DIAGNOSIS — M5412 Radiculopathy, cervical region: Secondary | ICD-10-CM

## 2022-01-22 DIAGNOSIS — M542 Cervicalgia: Secondary | ICD-10-CM | POA: Diagnosis not present

## 2022-01-22 DIAGNOSIS — M5412 Radiculopathy, cervical region: Secondary | ICD-10-CM | POA: Diagnosis not present

## 2022-01-24 ENCOUNTER — Other Ambulatory Visit: Payer: Self-pay | Admitting: Gerontology

## 2022-01-24 DIAGNOSIS — M542 Cervicalgia: Secondary | ICD-10-CM | POA: Diagnosis not present

## 2022-01-24 DIAGNOSIS — M5412 Radiculopathy, cervical region: Secondary | ICD-10-CM | POA: Diagnosis not present

## 2022-01-24 DIAGNOSIS — Z1231 Encounter for screening mammogram for malignant neoplasm of breast: Secondary | ICD-10-CM

## 2022-02-02 ENCOUNTER — Ambulatory Visit
Admission: RE | Admit: 2022-02-02 | Discharge: 2022-02-02 | Disposition: A | Discharge status: Home / Self Care | Payer: BC Managed Care – PPO | Source: Ambulatory Visit | Attending: Family Medicine | Admitting: Family Medicine

## 2022-02-02 DIAGNOSIS — M5412 Radiculopathy, cervical region: Secondary | ICD-10-CM

## 2022-02-05 DIAGNOSIS — M542 Cervicalgia: Secondary | ICD-10-CM | POA: Diagnosis not present

## 2022-02-05 DIAGNOSIS — M5412 Radiculopathy, cervical region: Secondary | ICD-10-CM | POA: Diagnosis not present

## 2022-02-07 ENCOUNTER — Ambulatory Visit: Payer: BC Managed Care – PPO | Attending: Gerontology

## 2022-02-07 DIAGNOSIS — M542 Cervicalgia: Secondary | ICD-10-CM | POA: Diagnosis not present

## 2022-02-07 DIAGNOSIS — M5412 Radiculopathy, cervical region: Secondary | ICD-10-CM | POA: Diagnosis not present

## 2022-02-12 DIAGNOSIS — M542 Cervicalgia: Secondary | ICD-10-CM | POA: Diagnosis not present

## 2022-02-12 DIAGNOSIS — M5412 Radiculopathy, cervical region: Secondary | ICD-10-CM | POA: Diagnosis not present

## 2022-02-14 DIAGNOSIS — M542 Cervicalgia: Secondary | ICD-10-CM | POA: Diagnosis not present

## 2022-02-14 DIAGNOSIS — M5412 Radiculopathy, cervical region: Secondary | ICD-10-CM | POA: Diagnosis not present

## 2022-02-19 DIAGNOSIS — M542 Cervicalgia: Secondary | ICD-10-CM | POA: Diagnosis not present

## 2022-02-19 DIAGNOSIS — M5412 Radiculopathy, cervical region: Secondary | ICD-10-CM | POA: Diagnosis not present

## 2022-02-21 DIAGNOSIS — M542 Cervicalgia: Secondary | ICD-10-CM | POA: Diagnosis not present

## 2022-02-21 DIAGNOSIS — M5412 Radiculopathy, cervical region: Secondary | ICD-10-CM | POA: Diagnosis not present

## 2022-02-27 DIAGNOSIS — M5412 Radiculopathy, cervical region: Secondary | ICD-10-CM | POA: Diagnosis not present

## 2022-02-27 DIAGNOSIS — M542 Cervicalgia: Secondary | ICD-10-CM | POA: Diagnosis not present

## 2022-03-06 DIAGNOSIS — M542 Cervicalgia: Secondary | ICD-10-CM | POA: Diagnosis not present

## 2022-03-06 DIAGNOSIS — M5412 Radiculopathy, cervical region: Secondary | ICD-10-CM | POA: Diagnosis not present

## 2022-03-08 DIAGNOSIS — M5412 Radiculopathy, cervical region: Secondary | ICD-10-CM | POA: Diagnosis not present

## 2022-03-08 DIAGNOSIS — M542 Cervicalgia: Secondary | ICD-10-CM | POA: Diagnosis not present

## 2022-03-26 DIAGNOSIS — M5030 Other cervical disc degeneration, unspecified cervical region: Secondary | ICD-10-CM | POA: Diagnosis not present

## 2022-03-26 DIAGNOSIS — M5412 Radiculopathy, cervical region: Secondary | ICD-10-CM | POA: Diagnosis not present

## 2022-03-26 DIAGNOSIS — M62830 Muscle spasm of back: Secondary | ICD-10-CM | POA: Diagnosis not present

## 2022-08-06 DIAGNOSIS — H2513 Age-related nuclear cataract, bilateral: Secondary | ICD-10-CM | POA: Diagnosis not present

## 2022-08-06 DIAGNOSIS — H524 Presbyopia: Secondary | ICD-10-CM | POA: Diagnosis not present

## 2022-09-05 DIAGNOSIS — H2511 Age-related nuclear cataract, right eye: Secondary | ICD-10-CM | POA: Diagnosis not present

## 2022-09-05 DIAGNOSIS — H2513 Age-related nuclear cataract, bilateral: Secondary | ICD-10-CM | POA: Diagnosis not present

## 2022-09-12 ENCOUNTER — Encounter: Payer: Self-pay | Admitting: Ophthalmology

## 2022-09-12 NOTE — Discharge Instructions (Signed)

## 2022-09-17 ENCOUNTER — Other Ambulatory Visit: Payer: Self-pay

## 2022-09-17 ENCOUNTER — Encounter
Admission: RE | Disposition: A | Discharge status: Home / Self Care | Payer: Self-pay | Source: Home / Self Care | Attending: Ophthalmology

## 2022-09-17 ENCOUNTER — Ambulatory Visit: Payer: BC Managed Care – PPO | Admitting: Anesthesiology

## 2022-09-17 ENCOUNTER — Encounter: Payer: Self-pay | Admitting: Ophthalmology

## 2022-09-17 ENCOUNTER — Ambulatory Visit
Admission: RE | Admit: 2022-09-17 | Discharge: 2022-09-17 | Disposition: A | Discharge status: Home / Self Care | Payer: BC Managed Care – PPO | Attending: Ophthalmology | Admitting: Ophthalmology

## 2022-09-17 DIAGNOSIS — H25041 Posterior subcapsular polar age-related cataract, right eye: Secondary | ICD-10-CM | POA: Diagnosis not present

## 2022-09-17 DIAGNOSIS — Z833 Family history of diabetes mellitus: Secondary | ICD-10-CM | POA: Diagnosis not present

## 2022-09-17 DIAGNOSIS — H2511 Age-related nuclear cataract, right eye: Secondary | ICD-10-CM | POA: Insufficient documentation

## 2022-09-17 DIAGNOSIS — Z7984 Long term (current) use of oral hypoglycemic drugs: Secondary | ICD-10-CM | POA: Insufficient documentation

## 2022-09-17 DIAGNOSIS — K219 Gastro-esophageal reflux disease without esophagitis: Secondary | ICD-10-CM | POA: Diagnosis not present

## 2022-09-17 DIAGNOSIS — E1136 Type 2 diabetes mellitus with diabetic cataract: Secondary | ICD-10-CM | POA: Insufficient documentation

## 2022-09-17 DIAGNOSIS — H2589 Other age-related cataract: Secondary | ICD-10-CM | POA: Diagnosis not present

## 2022-09-17 DIAGNOSIS — E119 Type 2 diabetes mellitus without complications: Secondary | ICD-10-CM | POA: Diagnosis not present

## 2022-09-17 HISTORY — PX: CATARACT EXTRACTION W/PHACO: SHX586

## 2022-09-17 LAB — GLUCOSE, CAPILLARY
Glucose-Capillary: 265 mg/dL — ABNORMAL HIGH (ref 70–99)
Glucose-Capillary: 275 mg/dL — ABNORMAL HIGH (ref 70–99)

## 2022-09-17 SURGERY — PHACOEMULSIFICATION, CATARACT, WITH IOL INSERTION
Anesthesia: Monitor Anesthesia Care | Site: Eye | Laterality: Right

## 2022-09-17 MED ORDER — SIGHTPATH DOSE#1 BSS IO SOLN
INTRAOCULAR | Status: DC | PRN
  Administered 2022-09-17: 84 mL via OPHTHALMIC

## 2022-09-17 MED ORDER — LACTATED RINGERS IV SOLN: INTRAVENOUS | Status: DC

## 2022-09-17 MED ORDER — MOXIFLOXACIN HCL 0.5 % OP SOLN
OPHTHALMIC | Status: DC | PRN
  Administered 2022-09-17: .2 mL via OPHTHALMIC

## 2022-09-17 MED ORDER — ARMC OPHTHALMIC DILATING DROPS
1.0000 | OPHTHALMIC | Status: DC | PRN
  Administered 2022-09-17 (×3): 1 via OPHTHALMIC

## 2022-09-17 MED ORDER — TETRACAINE HCL 0.5 % OP SOLN
1.0000 [drp] | OPHTHALMIC | Status: DC | PRN
  Administered 2022-09-17 (×3): 1 [drp] via OPHTHALMIC

## 2022-09-17 MED ORDER — MIDAZOLAM HCL 2 MG/2ML IJ SOLN
INTRAMUSCULAR | Status: DC | PRN
  Administered 2022-09-17: 2 mg via INTRAVENOUS

## 2022-09-17 MED ORDER — ACETAMINOPHEN 325 MG PO TABS
325.0000 mg | ORAL_TABLET | ORAL | Status: DC | PRN
  Administered 2022-09-17: 650 mg via ORAL

## 2022-09-17 MED ORDER — SIGHTPATH DOSE#1 NA HYALUR & NA CHOND-NA HYALUR IO KIT
PACK | INTRAOCULAR | Status: DC | PRN
  Administered 2022-09-17: 1 via OPHTHALMIC

## 2022-09-17 MED ORDER — INSULIN REGULAR HUMAN 100 UNIT/ML IJ SOLN
5.0000 [IU] | Freq: Once | INTRAMUSCULAR | Status: AC
  Administered 2022-09-17: 5 [IU] via SUBCUTANEOUS

## 2022-09-17 MED ORDER — BRIMONIDINE TARTRATE-TIMOLOL 0.2-0.5 % OP SOLN
OPHTHALMIC | Status: DC | PRN
  Administered 2022-09-17: 1 [drp] via OPHTHALMIC

## 2022-09-17 MED ORDER — INSULIN NPH (HUMAN) (ISOPHANE) 100 UNIT/ML ~~LOC~~ SUSP: 5.0000 [IU] | Freq: Once | SUBCUTANEOUS | Status: DC

## 2022-09-17 MED ORDER — ACETAMINOPHEN 160 MG/5ML PO SOLN: 325.0000 mg | ORAL | Status: DC | PRN

## 2022-09-17 MED ORDER — LIDOCAINE HCL (PF) 2 % IJ SOLN
INTRAOCULAR | Status: DC | PRN
  Administered 2022-09-17: 1 mL via INTRAOCULAR

## 2022-09-17 MED ORDER — FENTANYL CITRATE (PF) 100 MCG/2ML IJ SOLN
INTRAMUSCULAR | Status: DC | PRN
  Administered 2022-09-17: 50 ug via INTRAVENOUS

## 2022-09-17 MED ORDER — TRYPAN BLUE 0.06 % IO SOSY
PREFILLED_SYRINGE | INTRAOCULAR | Status: DC | PRN
  Administered 2022-09-17: .5 mL via INTRAOCULAR

## 2022-09-17 MED ORDER — SIGHTPATH DOSE#1 BSS IO SOLN
INTRAOCULAR | Status: DC | PRN
  Administered 2022-09-17 (×2): 15 mL

## 2022-09-17 MED ORDER — SODIUM HYALURONATE 23MG/ML IO SOSY
PREFILLED_SYRINGE | INTRAOCULAR | Status: DC | PRN
  Administered 2022-09-17: .6 mL via INTRAOCULAR

## 2022-09-17 SURGICAL SUPPLY — 24 items
CANNULA ANT/CHMB 27G (MISCELLANEOUS) IMPLANT
CANNULA ANT/CHMB 27GA (MISCELLANEOUS) ×1 IMPLANT
CATARACT SUITE SIGHTPATH (MISCELLANEOUS) ×1 IMPLANT
DISSECTOR HYDRO NUCLEUS 50X22 (MISCELLANEOUS) ×1 IMPLANT
DRSG TEGADERM 2-3/8X2-3/4 SM (GAUZE/BANDAGES/DRESSINGS) ×1 IMPLANT
FEE CATARACT SUITE SIGHTPATH (MISCELLANEOUS) ×1 IMPLANT
GLOVE SURG SYN 7.5  E (GLOVE) ×1
GLOVE SURG SYN 7.5 E (GLOVE) ×1 IMPLANT
GLOVE SURG SYN 7.5 PF PI (GLOVE) ×1 IMPLANT
GLOVE SURG SYN 8.5  E (GLOVE) ×1
GLOVE SURG SYN 8.5 E (GLOVE) ×1 IMPLANT
GLOVE SURG SYN 8.5 PF PI (GLOVE) ×1 IMPLANT
LENS IOL ACRSF MP 24.5 (Intraocular Lens) IMPLANT
LENS IOL ACRYSOF POST 24.5 (Intraocular Lens) ×1 IMPLANT
LENS IOL TECNIS EYHANCE 25.5 (Intraocular Lens) IMPLANT
NDL FILTER BLUNT 18X1 1/2 (NEEDLE) IMPLANT
NEEDLE FILTER BLUNT 18X1 1/2 (NEEDLE) IMPLANT
PACK VIT ANT 23G (MISCELLANEOUS) IMPLANT
RING MALYGIN (MISCELLANEOUS) IMPLANT
SUT ETHILON 10-0 CS-B-6CS-B-6 (SUTURE)
SUTURE EHLN 10-0 CS-B-6CS-B-6 (SUTURE) IMPLANT
SYR 3ML LL SCALE MARK (SYRINGE) IMPLANT
SYR 5ML LL (SYRINGE) IMPLANT
WATER STERILE IRR 250ML POUR (IV SOLUTION) ×1 IMPLANT

## 2022-09-17 NOTE — Transfer of Care (Signed)
Immediate Anesthesia Transfer of Care Note  Patient: Betty Fernandez  Procedure(s) Performed: CATARACT EXTRACTION PHACO AND INTRAOCULAR LENS PLACEMENT (IOC) RIGHT DIABETIC (Right: Eye)  Patient Location: PACU  Anesthesia Type: MAC  Level of Consciousness: awake, alert  and patient cooperative  Airway and Oxygen Therapy: Patient Spontanous Breathing and Patient connected to supplemental oxygen  Post-op Assessment: Post-op Vital signs reviewed, Patient's Cardiovascular Status Stable, Respiratory Function Stable, Patent Airway and No signs of Nausea or vomiting  Post-op Vital Signs: Reviewed and stable  Complications: No notable events documented.

## 2022-09-17 NOTE — Anesthesia Postprocedure Evaluation (Signed)
Anesthesia Post Note  Patient: Betty Fernandez  Procedure(s) Performed: CATARACT EXTRACTION PHACO AND INTRAOCULAR LENS PLACEMENT (IOC) RIGHT DIABETIC (Right: Eye)  Patient location during evaluation: Phase II Anesthesia Type: MAC Level of consciousness: awake and alert Pain management: pain level controlled Vital Signs Assessment: post-procedure vital signs reviewed and stable Respiratory status: spontaneous breathing, nonlabored ventilation, respiratory function stable and patient connected to nasal cannula oxygen Cardiovascular status: stable and blood pressure returned to baseline Postop Assessment: no apparent nausea or vomiting Anesthetic complications: no   No notable events documented.   Last Vitals:  Vitals:   09/17/22 0903 09/17/22 0907  BP: (!) 142/81 (!) 130/94  Pulse: 84 82  Resp: 17 16  Temp: 36.4 C   SpO2: 99% 98%    Last Pain:  Vitals:   09/17/22 0907  TempSrc:   PainSc: 4                  Precious Haws Jiovani Mccammon

## 2022-09-17 NOTE — Op Note (Signed)
OPERATIVE NOTE  Betty Fernandez [id number] 09/17/2022   PREOPERATIVE DIAGNOSIS: Mature cataract right eye. 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 Item Serial No. Manufacturer Lot No. LRB No. Used Action  LENS IOL TECNIS EYHANCE 25.5 - [id number] Intraocular Lens LENS IOL TECNIS EYHANCE 25.5 [id number] [facility]  Right 1 Wasted  ALCON ACRSOF IOL 24.5 D   [id number]   Right 1 Implanted      SURGEON:  Julious Payer. Rolley Sims, MD   ANESTHESIA:  Topical with tetracaine drops, augmented with 1% preservative-free intracameral lidocaine.   COMPLICATIONS:  None.   DESCRIPTION OF PROCEDURE:  The patient was identified in the holding room and transported to the operating room and placed in the supine position under the operating microscope.  The right eye was identified as the operative eye, which was prepped and draped in the usual sterile ophthalmic fashion.   A 1 millimeter clear-corneal paracentesis was made superotemporally. Preservative-free 1% lidocaine mixed with 1:1,000 bisulfite-free aqueous solution of epinephrine was injected into the anterior chamber. The cataract had become white and intumescent since the last exam, so there was no red reflex. Trypan blue was injected intracamerally to stain the anterior capsule and facilitate safe creation of the capsulorrhexis. The anterior chamber was then filled with Healon V viscoelastic. A 2.4 millimeter keratome was used to make a clear-corneal incision inferotemporally. A curvilinear capsulorrhexis was made with a cystotome and capsulorrhexis forceps. Balanced salt solution was used to hydrodissect the nucleus. Phacoemulsification was then used  to remove the lens nucleus and epinucleus. The remaining cortex was then removed using the irrigation and aspiration handpiece. The rhexis was too large to safely place a 1-piece IOL, so the main incision was enlarged with the keratome to accommodate the Alcon B cartidge to insert a 3 piece IOL. Provisc was then placed between the anterior capsule and the iris to to distend the sulcus for lens placement. A +24.50 [id number] intraocular lens was then injected into the sulcus. At least partial optic capture was noted superiorly. The remaining viscoelastic was aspirated.   Wounds were hydrated with balanced salt solution.  The anterior chamber was inflated to a physiologic pressure with balanced salt solution.  No wound leaks were noted. Vigamox was injected intracamerally.  Timolol and Brimonidine drops were applied to the eye.  The patient was taken to the recovery room in stable condition without complications of anesthesia or surgery.  [employer] 09/17/2022, 9:00 AM

## 2022-09-17 NOTE — H&P (Signed)
[facility]   Primary Care Physician:  Ward, [name], MD Ophthalmologist: Dr. Deberah Pelton  Pre-Procedure History & Physical: HPI:  Betty Fernandez is a 51 y.o. female here for cataract surgery.   Past Medical History:  Diagnosis Date   Diabetes mellitus without complication (HCC)    GERD (gastroesophageal reflux disease)     Past Surgical History:  Procedure Laterality Date   ELBOW SURGERY  2021    Prior to Admission medications   Medication Sig Start Date End Date Taking? Authorizing Provider  Canagliflozin-metFORMIN HCl (INVOKAMET) 50-1000 MG TABS Take 1 tablet by mouth 2 (two) times daily. 08/04/19  Yes [provider]  omeprazole (PRILOSEC) 20 MG capsule Take 20 mg by mouth daily. 03/07/20  Yes [provider]  clotrimazole-betamethasone (LOTRISONE) cream Apply externally BID for 2 wks Patient not taking: Reported on 09/12/2022 03/07/21   Copland, Ilona Sorrel, PA-C  fluconazole (DIFLUCAN) 150 MG tablet Take one tablet every 3 days for 3 doses, then once wkly as preventive for 6 months Patient not taking: Reported on 09/12/2022 09/27/21   Natale Milch, MD  hydrocortisone 2.5 % ointment Apply topically as directed. Apply once to twice daily as needed to itch. Patient not taking: Reported on 09/12/2022 01/05/21   Willeen Niece, MD  hydrocortisone-pramoxine [facility]) 2.5-1 % rectal cream Apply to affected areas groin three times daily as needed for itchy. Patient not taking: Reported on 09/12/2022 01/03/21   Willeen Niece, MD  metroNIDAZOLE 1.3 % GEL One applicator qhs x 5 nights for bacterial vaginitis Patient not taking: Reported on 11/29/2020 06/27/20   Rodriguez-Southworth, Nettie Elm, PA-C    Allergies as of 09/05/2022   (No Known Allergies)    Family History  Problem Relation Age of Onset   Diabetes Mother    Healthy Father     Social History   Socioeconomic History   Marital status: Single    Spouse name: Not on file   Number of  children: Not on file   Years of education: Not on file   Highest education level: Not on file  Occupational History   Not on file  Tobacco Use   Smoking status: Never   Smokeless tobacco: Never  Vaping Use   Vaping Use: Never used  Substance and Sexual Activity   Alcohol use: Never   Drug use: Never   Sexual activity: Yes  Other Topics Concern   Not on file  Social History Narrative   Not on file   Social Determinants of Health   Financial Resource Strain: Not on file  Food Insecurity: Not on file  Transportation Needs: Not on file  Physical Activity: Not on file  Stress: Not on file  Social Connections: Not on file  Intimate Partner Violence: Not on file    Review of Systems: See HPI, otherwise negative ROS  Physical Exam: BP (!) 150/91   Pulse 90   Temp 99.3 F (37.4 C) (Temporal)   Ht 5\' 5"  (1.651 m)   Wt 76.2 kg   SpO2 100%   BMI 27.96 kg/m  General:   Alert, cooperative in NAD Head:  Normocephalic and atraumatic. Respiratory:  Normal work of breathing. Cardiovascular:  RRR  Impression/Plan: is here for cataract surgery.  Risks, benefits, limitations, and alternatives regarding cataract surgery have been reviewed with the patient.  Questions have been answered.  All parties agreeable.   Gillermina Phy, MD  09/17/2022, 7:18 AM

## 2022-09-17 NOTE — Anesthesia Preprocedure Evaluation (Signed)
Anesthesia Evaluation  Patient identified by MRN, date of birth, ID band Patient awake    Reviewed: Allergy & Precautions, NPO status , Patient's Chart, lab work & pertinent test results  History of Anesthesia Complications Negative for: history of anesthetic complications  Airway Mallampati: III  TM Distance: <3 FB Neck ROM: full    Dental  (+) Chipped, Poor Dentition, Missing   Pulmonary neg pulmonary ROS, neg shortness of breath   Pulmonary exam normal        Cardiovascular Exercise Tolerance: Good negative cardio ROS Normal cardiovascular exam     Neuro/Psych negative neurological ROS  negative psych ROS   GI/Hepatic Neg liver ROS,GERD  Controlled,,  Endo/Other  negative endocrine ROSdiabetes, Type 2    Renal/GU      Musculoskeletal   Abdominal   Peds  Hematology negative hematology ROS (+)   Anesthesia Other Findings Past Medical History: No date: Diabetes mellitus without complication (HCC) No date: GERD (gastroesophageal reflux disease)  Past Surgical History: 2021: ELBOW SURGERY  BMI    Body Mass Index: 27.96 kg/m      Reproductive/Obstetrics negative OB ROS                             Anesthesia Physical Anesthesia Plan  ASA: 3  Anesthesia Plan: MAC   Post-op Pain Management:    Induction: Intravenous  PONV Risk Score and Plan:   Airway Management Planned: Natural Airway and Nasal Cannula  Additional Equipment:   Intra-op Plan:   Post-operative Plan:   Informed Consent: I have reviewed the patients History and Physical, chart, labs and discussed the procedure including the risks, benefits and alternatives for the proposed anesthesia with the patient or authorized representative who has indicated his/her understanding and acceptance.     Dental Advisory Given and Interpreter used for interveiw  Plan Discussed with: Anesthesiologist, CRNA and  Surgeon  Anesthesia Plan Comments: (Patient consented for risks of anesthesia including but not limited to:  - adverse reactions to medications - damage to eyes, teeth, lips or other oral mucosa - nerve damage due to positioning  - sore throat or hoarseness - Damage to heart, brain, nerves, lungs, other parts of body or loss of life  Patient voiced understanding.)       Anesthesia Quick Evaluation

## 2022-09-18 ENCOUNTER — Encounter: Payer: Self-pay | Admitting: Ophthalmology

## 2022-09-25 DIAGNOSIS — H2512 Age-related nuclear cataract, left eye: Secondary | ICD-10-CM | POA: Diagnosis not present

## 2022-09-26 NOTE — Discharge Instructions (Addendum)
El cuidado despus de una operacin de cataratas Cataract Surgery, Care After (Spanish)  Esta hoja le da informacin sobre cmo cuidarse despus de su ciruga. Su oftalmlogo puede darle instrucciones ms especficas tambin. Si tiene problemas o preguntas, comunquese con su doctor en el [facility], [phone].  Qu puedo esperar despus de la ciruga? Es normal tener: Picazn Sensacin de tener un cuerpo extrao (se siente como un grano de arena en el ojo) Secrecin acuosa (lagrimeo excesivo) Sensibilidad a la luz y al tacto Moretones en el ojo o a su alrededor Visin ligeramente borrosa  Siga estas instrucciones en casa: No se toque ni se frote los ojos. Es posible que le digan que use una pantalla protectora o lentes de sol para proteger sus ojos. No se ponga un lente de contacto en el ojo operado hasta que su doctor lo apruebe. Mantenga los prpados y la cara limpios y secos. No deje que el agua le caiga directamente en la cara mientras se duche. Evite el jabn y champ en los ojos. No se maquille los ojos por una semana.  Revsese su ojo todos los das para ver si hay  signos de infeccin. Est atento(a): Enrojecimiento, hinchazn o dolor. Lquido, sangre o pus. Empeoramiento de la visin. Aumento de la sensibilidad a la luz o al tacto.  Actividad: Durante el primer da, evite agacharse y leer. Puede volver a leer y a agacharse al da siguiente. No maneje ni use maquinaria pesada durante al menos 24 horas. Evite las actividades vigorosas durante una semana. Est bien realizar actividades como caminar, usar la cinta de correr, usar la bicicleta esttica y subir las escaleras. No levante objetos pesados (ms de 20 libras) por una semana. No realice trabajos de jardinera ni tareas domsticas sucias en la casa (como limpiar los pisos, los baos, pasar la aspiradora, etc.) durante una semana. No nade ni use un jacuzzi durante 2 semanas.  Pregunte a su doctor cundo  usted puede volver a trabajar.  Instrucciones generales: Tome o aplique los medicamentos recetados y de venta libre segn le indique su doctor, incluyendo las gotas para los ojos y las pomadas. Contine tomando los medicamentos que fueron suspendidos antes de la ciruga, a menos que su doctor le indique lo contrario. Vaya a todas sus citas de seguimiento que ya fueron programadas.   Pngase en contacto con un proveedor de atencin mdica si: Le aparecen ms moretones alrededor del ojo. Tiene dolor que no se alivia con el medicamento. Tiene fiebre. Le sale lquido, pus o sangre del ojo o de la incisin. Su sensibilidad a la luz empeora. Tiene manchas (moscas volantes) o destellos de luz en la vista. Tiene nuseas o vmitos.  Vaya al [facility] ms cercana o llame al 911 si: Pierde la vista de forma repentina. Tiene un dolor de ojo que es intenso y que se est empeorando. 

## 2022-09-27 ENCOUNTER — Other Ambulatory Visit: Payer: Self-pay

## 2022-09-27 ENCOUNTER — Ambulatory Visit: Payer: BC Managed Care – PPO | Admitting: Anesthesiology

## 2022-09-27 ENCOUNTER — Ambulatory Visit
Admission: RE | Admit: 2022-09-27 | Discharge: 2022-09-27 | Disposition: A | Discharge status: Home / Self Care | Payer: BC Managed Care – PPO | Attending: Ophthalmology | Admitting: Ophthalmology

## 2022-09-27 ENCOUNTER — Encounter
Admission: RE | Disposition: A | Discharge status: Home / Self Care | Payer: Self-pay | Source: Home / Self Care | Attending: Ophthalmology

## 2022-09-27 ENCOUNTER — Encounter: Payer: Self-pay | Admitting: Ophthalmology

## 2022-09-27 DIAGNOSIS — Z7984 Long term (current) use of oral hypoglycemic drugs: Secondary | ICD-10-CM | POA: Diagnosis not present

## 2022-09-27 DIAGNOSIS — E1136 Type 2 diabetes mellitus with diabetic cataract: Secondary | ICD-10-CM | POA: Diagnosis present

## 2022-09-27 DIAGNOSIS — Z794 Long term (current) use of insulin: Secondary | ICD-10-CM | POA: Insufficient documentation

## 2022-09-27 DIAGNOSIS — H2512 Age-related nuclear cataract, left eye: Secondary | ICD-10-CM | POA: Diagnosis not present

## 2022-09-27 DIAGNOSIS — K219 Gastro-esophageal reflux disease without esophagitis: Secondary | ICD-10-CM | POA: Insufficient documentation

## 2022-09-27 DIAGNOSIS — Z833 Family history of diabetes mellitus: Secondary | ICD-10-CM | POA: Insufficient documentation

## 2022-09-27 DIAGNOSIS — E11620 Type 2 diabetes mellitus with diabetic dermatitis: Secondary | ICD-10-CM

## 2022-09-27 HISTORY — PX: CATARACT EXTRACTION W/PHACO: SHX586

## 2022-09-27 LAB — GLUCOSE, CAPILLARY: Glucose-Capillary: 237 mg/dL — ABNORMAL HIGH (ref 70–99)

## 2022-09-27 SURGERY — PHACOEMULSIFICATION, CATARACT, WITH IOL INSERTION
Anesthesia: Monitor Anesthesia Care | Site: Eye | Laterality: Left

## 2022-09-27 MED ORDER — TRYPAN BLUE 0.06 % IO SOSY
PREFILLED_SYRINGE | INTRAOCULAR | Status: DC | PRN
  Administered 2022-09-27: .5 mL via INTRAOCULAR

## 2022-09-27 MED ORDER — FENTANYL CITRATE (PF) 100 MCG/2ML IJ SOLN
INTRAMUSCULAR | Status: DC | PRN
  Administered 2022-09-27 (×2): 50 ug via INTRAVENOUS

## 2022-09-27 MED ORDER — ARMC OPHTHALMIC DILATING DROPS
1.0000 | OPHTHALMIC | Status: DC | PRN
  Administered 2022-09-27 (×3): 1 via OPHTHALMIC

## 2022-09-27 MED ORDER — LACTATED RINGERS IV SOLN: INTRAVENOUS | Status: DC

## 2022-09-27 MED ORDER — MIDAZOLAM HCL 2 MG/2ML IJ SOLN
INTRAMUSCULAR | Status: DC | PRN
  Administered 2022-09-27 (×2): 1 mg via INTRAVENOUS

## 2022-09-27 MED ORDER — SIGHTPATH DOSE#1 BSS IO SOLN
INTRAOCULAR | Status: DC | PRN
  Administered 2022-09-27: 98 mL via OPHTHALMIC

## 2022-09-27 MED ORDER — MOXIFLOXACIN HCL 0.5 % OP SOLN
OPHTHALMIC | Status: DC | PRN
  Administered 2022-09-27: .2 mL via OPHTHALMIC

## 2022-09-27 MED ORDER — TETRACAINE HCL 0.5 % OP SOLN
1.0000 [drp] | OPHTHALMIC | Status: DC | PRN
  Administered 2022-09-27 (×3): 1 [drp] via OPHTHALMIC

## 2022-09-27 MED ORDER — SIGHTPATH DOSE#1 NA HYALUR & NA CHOND-NA HYALUR IO KIT
PACK | INTRAOCULAR | Status: DC | PRN
  Administered 2022-09-27: 1 via OPHTHALMIC

## 2022-09-27 MED ORDER — LIDOCAINE HCL (PF) 2 % IJ SOLN
INTRAOCULAR | Status: DC | PRN
  Administered 2022-09-27: 4 mL via INTRAOCULAR

## 2022-09-27 MED ORDER — BRIMONIDINE TARTRATE-TIMOLOL 0.2-0.5 % OP SOLN
OPHTHALMIC | Status: DC | PRN
  Administered 2022-09-27: 1 [drp] via OPHTHALMIC

## 2022-09-27 MED ORDER — SIGHTPATH DOSE#1 BSS IO SOLN
INTRAOCULAR | Status: DC | PRN
  Administered 2022-09-27: 15 mL via INTRAOCULAR

## 2022-09-27 SURGICAL SUPPLY — 22 items
CANNULA ANT/CHMB 27G (MISCELLANEOUS) IMPLANT
CANNULA ANT/CHMB 27GA (MISCELLANEOUS) ×1 IMPLANT
CATARACT SUITE SIGHTPATH (MISCELLANEOUS) ×1 IMPLANT
DISSECTOR HYDRO NUCLEUS 50X22 (MISCELLANEOUS) ×1 IMPLANT
DRSG TEGADERM 2-3/8X2-3/4 SM (GAUZE/BANDAGES/DRESSINGS) ×1 IMPLANT
FEE CATARACT SUITE SIGHTPATH (MISCELLANEOUS) ×1 IMPLANT
GLOVE SURG SYN 7.5  E (GLOVE) ×1
GLOVE SURG SYN 7.5 E (GLOVE) ×1 IMPLANT
GLOVE SURG SYN 7.5 PF PI (GLOVE) ×1 IMPLANT
GLOVE SURG SYN 8.5  E (GLOVE) ×1
GLOVE SURG SYN 8.5 E (GLOVE) ×1 IMPLANT
GLOVE SURG SYN 8.5 PF PI (GLOVE) ×1 IMPLANT
LENS IOL TECNIS EYHANCE 25.5 (Intraocular Lens) IMPLANT
NDL FILTER BLUNT 18X1 1/2 (NEEDLE) IMPLANT
NEEDLE FILTER BLUNT 18X1 1/2 (NEEDLE) IMPLANT
PACK VIT ANT 23G (MISCELLANEOUS) IMPLANT
RING MALYGIN (MISCELLANEOUS) IMPLANT
SUT ETHILON 10-0 CS-B-6CS-B-6 (SUTURE)
SUTURE EHLN 10-0 CS-B-6CS-B-6 (SUTURE) IMPLANT
SYR 3ML LL SCALE MARK (SYRINGE) IMPLANT
SYR 5ML LL (SYRINGE) IMPLANT
WATER STERILE IRR 250ML POUR (IV SOLUTION) ×1 IMPLANT

## 2022-09-27 NOTE — Transfer of Care (Signed)
Immediate Anesthesia Transfer of Care Note  Patient: Betty Fernandez  Procedure(s) Performed: CATARACT EXTRACTION PHACO AND INTRAOCULAR LENS PLACEMENT (IOC) LEFT DIABETIC VISION BLUE 32.16 02:26.2 (Left: Eye)  Patient Location: PACU  Anesthesia Type: MAC  Level of Consciousness: awake, alert  and patient cooperative  Airway and Oxygen Therapy: Patient Spontanous Breathing and Patient connected to supplemental oxygen  Post-op Assessment: Post-op Vital signs reviewed, Patient's Cardiovascular Status Stable, Respiratory Function Stable, Patent Airway and No signs of Nausea or vomiting  Post-op Vital Signs: Reviewed and stable  Complications: No notable events documented.

## 2022-09-27 NOTE — Anesthesia Preprocedure Evaluation (Signed)
Anesthesia Evaluation  Patient identified by MRN, date of birth, ID band Patient awake    Reviewed: Allergy & Precautions, NPO status , Patient's Chart, lab work & pertinent test results  History of Anesthesia Complications Negative for: history of anesthetic complications  Airway Mallampati: III  TM Distance: <3 FB Neck ROM: full    Dental  (+) Chipped, Poor Dentition, Missing, Dental Advidsory Given   Pulmonary neg pulmonary ROS, neg shortness of breath, neg COPD   Pulmonary exam normal        Cardiovascular Exercise Tolerance: Good (-) angina (-) Past MI and (-) CABG negative cardio ROS Normal cardiovascular exam     Neuro/Psych negative neurological ROS  negative psych ROS   GI/Hepatic Neg liver ROS,GERD  Controlled,,  Endo/Other  negative endocrine ROSdiabetes, Type 2    Renal/GU      Musculoskeletal   Abdominal   Peds  Hematology negative hematology ROS (+)   Anesthesia Other Findings Past Medical History: No date: Diabetes mellitus without complication (HCC) No date: GERD (gastroesophageal reflux disease)  Past Surgical History: 2021: ELBOW SURGERY  BMI    Body Mass Index: 27.96 kg/m      Reproductive/Obstetrics negative OB ROS                             Anesthesia Physical Anesthesia Plan  ASA: 3  Anesthesia Plan: MAC   Post-op Pain Management: Minimal or no pain anticipated   Induction: Intravenous  PONV Risk Score and Plan: 3 and TIVA and Midazolam  Airway Management Planned: Natural Airway and Nasal Cannula  Additional Equipment: None  Intra-op Plan:   Post-operative Plan:   Informed Consent: I have reviewed the patients History and Physical, chart, labs and discussed the procedure including the risks, benefits and alternatives for the proposed anesthesia with the patient or authorized representative who has indicated his/her understanding and  acceptance.     Dental Advisory Given and Interpreter used for interveiw  Plan Discussed with: Anesthesiologist, CRNA and Surgeon  Anesthesia Plan Comments: (Patient consented for risks of anesthesia including but not limited to:  - adverse reactions to medications - damage to eyes, teeth, lips or other oral mucosa - nerve damage due to positioning  - sore throat or hoarseness - Damage to heart, brain, nerves, lungs, other parts of body or loss of life  Patient voiced understanding.)       Anesthesia Quick Evaluation

## 2022-09-27 NOTE — H&P (Signed)
[facility]   Primary Care Physician:  Ward, [name], MD Ophthalmologist: Dr. Deberah Pelton  Pre-Procedure History & Physical: HPI:  Betty Fernandez is a 51 y.o. female here for cataract surgery.   Past Medical History:  Diagnosis Date   Diabetes mellitus without complication (HCC)    GERD (gastroesophageal reflux disease)     Past Surgical History:  Procedure Laterality Date   CATARACT EXTRACTION W/PHACO Right 09/17/2022   Procedure: CATARACT EXTRACTION PHACO AND INTRAOCULAR LENS PLACEMENT (IOC) RIGHT DIABETIC;  Surgeon: Estanislado Pandy, MD;  Location: [facility];  Service: Ophthalmology;  Laterality: Right;  27.12 2:03.5   ELBOW SURGERY  2021    Prior to Admission medications   Medication Sig Start Date End Date Taking? Authorizing Provider  Canagliflozin-metFORMIN HCl (INVOKAMET) 50-1000 MG TABS Take 1 tablet by mouth 2 (two) times daily. 08/04/19  Yes [provider]  insulin glargine (LANTUS) 100 UNIT/ML injection Inject 18 Units into the skin daily.   Yes [provider]  omeprazole (PRILOSEC) 20 MG capsule Take 20 mg by mouth daily. 03/07/20  Yes [provider]  clotrimazole-betamethasone (LOTRISONE) cream Apply externally BID for 2 wks Patient not taking: Reported on 09/12/2022 03/07/21   Copland, Ilona Sorrel, PA-C  fluconazole (DIFLUCAN) 150 MG tablet Take one tablet every 3 days for 3 doses, then once wkly as preventive for 6 months Patient not taking: Reported on 09/12/2022 09/27/21   Natale Milch, MD  hydrocortisone 2.5 % ointment Apply topically as directed. Apply once to twice daily as needed to itch. Patient not taking: Reported on 09/12/2022 01/05/21   Willeen Niece, MD  hydrocortisone-pramoxine [facility]) 2.5-1 % rectal cream Apply to affected areas groin three times daily as needed for itchy. Patient not taking: Reported on 09/12/2022 01/03/21   Willeen Niece, MD  metroNIDAZOLE 1.3 % GEL One applicator qhs x  5 nights for bacterial vaginitis Patient not taking: Reported on 11/29/2020 06/27/20   Rodriguez-Southworth, Nettie Elm, PA-C    Allergies as of 09/05/2022   (No Known Allergies)    Family History  Problem Relation Age of Onset   Diabetes Mother    Healthy Father     Social History   Socioeconomic History   Marital status: Single    Spouse name: Not on file   Number of children: Not on file   Years of education: Not on file   Highest education level: Not on file  Occupational History   Not on file  Tobacco Use   Smoking status: Never   Smokeless tobacco: Never  Vaping Use   Vaping Use: Never used  Substance and Sexual Activity   Alcohol use: Never   Drug use: Never   Sexual activity: Yes  Other Topics Concern   Not on file  Social History Narrative   Not on file   Social Determinants of Health   Financial Resource Strain: Not on file  Food Insecurity: Not on file  Transportation Needs: Not on file  Physical Activity: Not on file  Stress: Not on file  Social Connections: Not on file  Intimate Partner Violence: Not on file    Review of Systems: See HPI, otherwise negative ROS  Physical Exam: BP 134/87   Pulse 98   Temp 98.7 F (37.1 C)   Ht 5\' 5"  (1.651 m)   Wt 74.8 kg   LMP 03/31/2022 (Approximate)   SpO2 95%   BMI 27.46 kg/m  General:   Alert, cooperative in NAD Head:  Normocephalic and atraumatic. Respiratory:  Normal work of breathing. Cardiovascular:  RRR  Impression/Plan: Betty Fernandez is here for cataract surgery.  Risks, benefits, limitations, and alternatives regarding cataract surgery have been reviewed with the patient.  Questions have been answered.  All parties agreeable.   Norvel Richards, MD  09/27/2022, 7:08 AM

## 2022-09-27 NOTE — Anesthesia Postprocedure Evaluation (Signed)
Anesthesia Post Note  Patient: Betty Fernandez  Procedure(s) Performed: CATARACT EXTRACTION PHACO AND INTRAOCULAR LENS PLACEMENT (IOC) LEFT DIABETIC VISION BLUE 32.16 02:26.2 (Left: Eye)  Patient location during evaluation: PACU Anesthesia Type: MAC Level of consciousness: awake and alert Pain management: pain level controlled Vital Signs Assessment: post-procedure vital signs reviewed and stable Respiratory status: spontaneous breathing, nonlabored ventilation, respiratory function stable and patient connected to nasal cannula oxygen Cardiovascular status: blood pressure returned to baseline and stable Postop Assessment: no apparent nausea or vomiting Anesthetic complications: no  No notable events documented.   Last Vitals:  Vitals:   09/27/22 0803 09/27/22 0809  BP:  126/76  Pulse:  91  Resp:  (!) 23  Temp: 36.6 C 36.6 C  SpO2:  98%    Last Pain:  Vitals:   09/27/22 0809  PainSc: 0-No pain                 Dimas Millin

## 2022-09-27 NOTE — Op Note (Signed)
OPERATIVE NOTE  Betty Fernandez [id number] 09/27/2022   PREOPERATIVE DIAGNOSIS: Nuclear sclerotic cataract left eye. H25.12   POSTOPERATIVE DIAGNOSIS: Nuclear sclerotic cataract left eye. H25.12   PROCEDURE:  Phacoemusification with posterior chamber intraocular lens placement of the left eye  Ultrasound time: Procedure(s) with comments: CATARACT EXTRACTION PHACO AND INTRAOCULAR LENS PLACEMENT (IOC) LEFT DIABETIC VISION BLUE 32.16 02:26.2 (Left) - Diabetic  LENS:   Implant Name Type Inv. Item Serial No. Manufacturer Lot No. LRB No. Used Action  LENS IOL TECNIS EYHANCE 25.5 - [id number] Intraocular Lens LENS IOL TECNIS EYHANCE 25.5 [id number] [employer]  Left 1 Implanted      SURGEON:  Julious Payer. Rolley Sims, MD   ANESTHESIA:  Topical with tetracaine drops, augmented with 1% preservative-free intracameral lidocaine.   COMPLICATIONS:  None.   DESCRIPTION OF PROCEDURE:  The patient was identified in the holding room and transported to the operating room and placed in the supine position under the operating microscope.  The left eye was identified as the operative eye, which was prepped and draped in the usual sterile ophthalmic fashion.   A 1 millimeter clear-corneal paracentesis was made inferotemporally. Preservative-free 1% lidocaine mixed with 1:1,000 bisulfite-free aqueous solution of epinephrine was injected into the anterior chamber.The cataract had become white since last exam, with diffuse cortical and posterior subcapsular opacification, so there was no red reflex. Trypan blue was injected intracamerally to stain the anterior capsule to facilitate safe creation of the capsuolrrhexisThe anterior chamber was then filled with Viscoat viscoelastic. A 2.4 millimeter keratome was used to make a clear-corneal incision superotemporally. A curvilinear capsulorrhexis was made with a cystotome and capsulorrhexis forceps. Balanced salt solution was used to hydrodissect and hydrodelineate the  nucleus. Phacoemulsification was then used to remove the lens nucleus and epinucleus. The remaining cortex was then removed using the irrigation and aspiration handpiece. Provisc was then placed into the capsular bag to distend it for lens placement. A +25.50 D [id number] intraocular lens was then injected into the capsular bag. The remaining viscoelastic was aspirated.   Wounds were hydrated with balanced salt solution.  The anterior chamber was inflated to a physiologic pressure with balanced salt solution.  No wound leaks were noted. Vigamox was injected intracamerally.  Timolol and Brimonidine drops were applied to the eye.  The patient was taken to the recovery room in stable condition without complications of anesthesia or surgery.  [name] 09/27/2022, 8:03 AM

## 2022-11-08 ENCOUNTER — Other Ambulatory Visit (HOSPITAL_COMMUNITY)
Admission: RE | Admit: 2022-11-08 | Discharge: 2022-11-08 | Disposition: A | Discharge status: Home / Self Care | Payer: BC Managed Care – PPO | Source: Ambulatory Visit | Attending: Obstetrics | Admitting: Obstetrics

## 2022-11-08 ENCOUNTER — Encounter: Payer: Self-pay | Admitting: Obstetrics

## 2022-11-08 ENCOUNTER — Ambulatory Visit (INDEPENDENT_AMBULATORY_CARE_PROVIDER_SITE_OTHER): Payer: BC Managed Care – PPO | Admitting: Obstetrics

## 2022-11-08 VITALS — BP 151/88 | HR 80 | Ht 63.0 in | Wt 165.0 lb

## 2022-11-08 DIAGNOSIS — Z01419 Encounter for gynecological examination (general) (routine) without abnormal findings: Secondary | ICD-10-CM

## 2022-11-08 DIAGNOSIS — Z124 Encounter for screening for malignant neoplasm of cervix: Secondary | ICD-10-CM | POA: Insufficient documentation

## 2022-11-08 DIAGNOSIS — E11620 Type 2 diabetes mellitus with diabetic dermatitis: Secondary | ICD-10-CM

## 2022-11-08 DIAGNOSIS — N761 Subacute and chronic vaginitis: Secondary | ICD-10-CM | POA: Diagnosis not present

## 2022-11-08 DIAGNOSIS — N898 Other specified noninflammatory disorders of vagina: Secondary | ICD-10-CM

## 2022-11-08 DIAGNOSIS — Z01411 Encounter for gynecological examination (general) (routine) with abnormal findings: Secondary | ICD-10-CM

## 2022-11-08 DIAGNOSIS — R399 Unspecified symptoms and signs involving the genitourinary system: Secondary | ICD-10-CM

## 2022-11-08 DIAGNOSIS — Z1231 Encounter for screening mammogram for malignant neoplasm of breast: Secondary | ICD-10-CM

## 2022-11-08 DIAGNOSIS — E11628 Type 2 diabetes mellitus with other skin complications: Secondary | ICD-10-CM | POA: Insufficient documentation

## 2022-11-08 LAB — POCT URINALYSIS DIPSTICK
Bilirubin, UA: NEGATIVE
Glucose, UA: POSITIVE — AB
Ketones, UA: NEGATIVE
Leukocytes, UA: NEGATIVE
Nitrite, UA: NEGATIVE
Protein, UA: POSITIVE — AB
Spec Grav, UA: <=1.005 — AB (ref 1.010–1.025)
Urobilinogen, UA: 0.2 E.U./dL
pH, UA: 5 (ref 5.0–8.0)

## 2022-11-08 NOTE — Progress Notes (Signed)
Gynecology Annual Exam  PCP: Ward, Honor Loh, MD  Chief Complaint:  Chief Complaint  Patient presents with   Gynecologic Exam    Has some small bumps in vaginal area that hurt/burn 1 yr and a half    History of Present Illness:Patient is a 52 y.o. G1P1001 presents for annual exam. The patient has no complaints today. Deniese does not speak [place], so has a Optometrist. She reports that she has been dismissed by her PCP for missed appointments, and wants lab work today. She is 52 years old, partnered and sexually active. She has not had an Well woman exam in awhile. She is also diabetic, but is not under the care of a PCP or endocrinologist.she is due for a pap smear and also c/o ongoing challenges with yeast vaginitis, and for which she has been prescribed diflucan in the past.  She is also due for a mammogram and requests this be ordered. She brings a copy of recent blood work with her today that has the results of health Maintenance labs, including a Lipid profile, Hgb A1C, and  CBC. She also shares that she has had a large number of bumps on her buttocks for several months and has not had them evaluated  LMP: No LMP recorded. Patient is perimenopausal. Her last period was about six months ago. Dysmenorrhea: no  The patient is sexually active. She denies dyspareunia.  The patient does not perform self breast exams.  There is no notable family history of breast or ovarian cancer in her family.  The patient wears seatbelts: yes.   The patient has regular exercise: no.    The patient denies current symptoms of depression.     Review of Systems: Review of Systems  Constitutional: Negative.   HENT: Negative.    Eyes: Negative.   Respiratory: Negative.    Cardiovascular: Negative.   Gastrointestinal:  Positive for constipation.       Reports some constipation  Genitourinary: Negative.   Musculoskeletal: Negative.   Skin:  Positive for rash.       Large area covered with brown  circular bumps, some of which are irritated and burn per pt report.  Neurological: Negative.   Endo/Heme/Allergies: Negative.   Psychiatric/Behavioral: Negative.      Past Medical History:  Patient Active Problem List   Diagnosis Date Noted   Type 2 diabetes mellitus with diabetic dermatitis, without long-term current use of insulin ([place]) 03/07/2021   Sebaceous gland hyperplasia of vulva 11/10/2020   Chronic vaginitis 11/10/2020    Past Surgical History:  Past Surgical History:  Procedure Laterality Date   CATARACT EXTRACTION W/PHACO Right 09/17/2022   Procedure: CATARACT EXTRACTION PHACO AND INTRAOCULAR LENS PLACEMENT ([place]) RIGHT DIABETIC;  Surgeon: Norvel Richards, MD;  Location: [place];  Service: Ophthalmology;  Laterality: Right;  27.12 2:03.5   CATARACT EXTRACTION W/PHACO Left 09/27/2022   Procedure: CATARACT EXTRACTION PHACO AND INTRAOCULAR LENS PLACEMENT ([place]) LEFT DIABETIC VISION BLUE 32.16 02:26.2;  Surgeon: Norvel Richards, MD;  Location: [place];  Service: Ophthalmology;  Laterality: Left;  Diabetic   ELBOW SURGERY  2021    Gynecologic History:  No LMP recorded. Patient is perimenopausal. Last Pap: Results were: no record of pap smear found in chart.  Unknown, although patient denies abnormal pap.   Last mammogram: 09/09/2019 Results were: BI-RAD I  Obstetric History: G1P1001  Family History:  Family History  Problem Relation Age of Onset   Diabetes Mother  Healthy Father     Social History:  Social History   Socioeconomic History   Marital status: Single    Spouse name: Not on file   Number of children: Not on file   Years of education: Not on file   Highest education level: Not on file  Occupational History   Not on file  Tobacco Use   Smoking status: Never   Smokeless tobacco: Never  Vaping Use   Vaping Use: Never used  Substance and Sexual Activity   Alcohol use: Never   Drug use: Never   Sexual activity:  Yes  Other Topics Concern   Not on file  Social History Narrative   Not on file   Social Determinants of Health   Financial Resource Strain: Not on file  Food Insecurity: Not on file  Transportation Needs: Not on file  Physical Activity: Not on file  Stress: Not on file  Social Connections: Not on file  Intimate Partner Violence: Not on file    Allergies:  No Known Allergies  Medications: Prior to Admission medications   Medication Sig Start Date End Date Taking? Authorizing Provider  insulin glargine (LANTUS) 100 UNIT/ML injection Inject 18 Units into the skin daily.   Yes [provider]  omeprazole (PRILOSEC) 20 MG capsule Take 20 mg by mouth daily. 03/07/20  Yes [provider]    Physical Exam Vitals: Blood pressure (!) 151/88, pulse 80, height 5\' 3"  (1.6 m), weight 165 lb (74.8 kg).  General: NAD HEENT: normocephalic, anicteric Thyroid: no enlargement, no palpable nodules Pulmonary: No increased work of breathing, CTAB Cardiovascular: RRR, distal pulses 2+ Breast: Breast symmetrical, no tenderness, no palpable nodules or masses, no skin or nipple retraction present, no nipple discharge.  No axillary or supraclavicular lymphadenopathy. Abdomen: NABS, soft, non-tender, non-distended.  Umbilicus without lesions.  No hepatomegaly, splenomegaly or masses palpable. No evidence of hernia  Genitourinary:  External: Normal external female genitalia.  Normal urethral meatus, normal Bartholin's and Skene's glands.    Vagina: Normal vaginal mucosa, no evidence of prolapse.    Cervix: Grossly normal in appearance, no bleeding  Uterus: Non-enlarged, mobile, normal contour.  No CMT  Adnexa: ovaries non-enlarged, no adnexal masses  Rectal: deferred  Lymphatic: no evidence of inguinal lymphadenopathy Extremities: no edema, erythema, or tenderness Neurologic: Grossly intact Psychiatric: mood appropriate, affect full  Female chaperone present for pelvic and breast   portions of the physical exam     Assessment: 52 y.o. G1P1001 routine annual exam  Plan: Problem List Items Addressed This Visit   None Visit Diagnoses     UTI symptoms       Relevant Orders   POCT Urinalysis Dipstick (Completed)       1) Mammogram - recommend yearly screening mammogram.  Mammogram Was ordered today  2) STI screening  wasoffered and accepted  3) ASCCP guidelines and rational discussed.  Patient opts for every 5 years screening interval. As there is no record of recent pap, I have retrieved a pa today.  4) Osteoporosis  - per USPTF routine screening DEXA at age 83 -  Consider FDA-approved medical therapies in postmenopausal women and men aged 20 years and older, based on the following: a) A hip or vertebral (clinical or morphometric) fracture b) T-score ? -2.5 at the femoral neck or spine after appropriate evaluation to exclude secondary causes C) Low bone mass (T-score between -1.0 and -2.5 at the femoral neck or spine) and a 10-year probability of a hip fracture ?  3% or a 10-year probability of a major osteoporosis-related fracture ? 20% based on the [place]-adapted WHO algorithm   5) Routine healthcare maintenance including cholesterol, diabetes screening discussed  with the pateint. Her HGB A1c is over 13, and she is strongly advised to establish care with a PCP or [facility] or an endocrinologist as her glucose is not being monitored or controlled.  6) Colonoscopy would be recommended once care established with a new PCP.Marland Kitchen  Screening recommended starting at age 18 for average risk individuals, age 16 for individuals deemed at increased risk (including African Americans) and recommended to continue until age 71.  For patient age 46-85 individualized approach is recommended.  Gold standard screening is via colonoscopy, Cologuard screening is an acceptable alternative for patient unwilling or unable to undergo colonoscopy.  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 I have explained that the treatment is related to  her diabetes and indicates the importance of managing her diabetes.  We will contact her regarding her Aptima swab (vaginal discharge), and pap smear results. Her mammogram has been ordered.  7) No follow-ups on file.    Imagene Riches, CNM  11/08/2022 2:30 PM   [place] 11/08/2022, 2:30 PM

## 2022-11-12 LAB — CERVICOVAGINAL ANCILLARY ONLY
Bacterial Vaginitis (gardnerella): POSITIVE — AB
Candida Glabrata: POSITIVE — AB
Candida Vaginitis: NEGATIVE
Comment: NEGATIVE
Comment: NEGATIVE
Comment: NEGATIVE

## 2022-11-14 LAB — CYTOLOGY - PAP
Comment: NEGATIVE
Diagnosis: NEGATIVE
High risk HPV: NEGATIVE

## 2022-11-17 ENCOUNTER — Encounter: Payer: Self-pay | Admitting: Obstetrics

## 2022-11-20 ENCOUNTER — Other Ambulatory Visit: Payer: Self-pay

## 2022-11-20 DIAGNOSIS — B379 Candidiasis, unspecified: Secondary | ICD-10-CM

## 2022-11-20 DIAGNOSIS — B9689 Other specified bacterial agents as the cause of diseases classified elsewhere: Secondary | ICD-10-CM

## 2022-11-20 MED ORDER — METRONIDAZOLE 500 MG PO TABS: 500.0000 mg | ORAL_TABLET | Freq: Two times a day (BID) | ORAL | 0 refills | Status: AC

## 2022-11-20 MED ORDER — FLUCONAZOLE 150 MG PO TABS: 150.0000 mg | ORAL_TABLET | Freq: Every day | ORAL | 0 refills | Status: AC

## 2023-01-03 ENCOUNTER — Telehealth: Payer: PRIVATE HEALTH INSURANCE

## 2023-01-03 NOTE — Telephone Encounter
Called and spoke with patient for appointment reminder and to request medical records. Patient provided with referring doctor information Dr. Mirian Capuchin. [place]  ph: [phone]  Per patient she was referred for Fatty liver and Cirrhosis.      Requested medical records  Fax: [phone]

## 2023-01-04 ENCOUNTER — Inpatient Hospital Stay: Payer: PRIVATE HEALTH INSURANCE | Attending: Student in an Organized Health Care Education/Training Program

## 2023-01-04 ENCOUNTER — Ambulatory Visit: Payer: PRIVATE HEALTH INSURANCE | Attending: Student in an Organized Health Care Education/Training Program

## 2023-01-04 DIAGNOSIS — B181 Chronic viral hepatitis B without delta-agent: Secondary | ICD-10-CM

## 2023-01-04 DIAGNOSIS — K760 Fatty (change of) liver, not elsewhere classified: Secondary | ICD-10-CM

## 2023-01-04 NOTE — H&P
Outpatient Hepatology H & P      PATIENT: [name] [name]  MRN: [id number]  DOB: 02-Jul-1971  DATE OF SERVICE: 01/04/2023  REFERRING PRACTITIONER: Rojter, Mirian Capuchin, MD  PRIMARY CARE PROVIDER: Rojter, Mirian Capuchin, MD  REASON FOR REFERRAL: Hepatitis B with Hepatitis Delta coinfection, MASLD  CHIEF COMPLAINT: Hepatitis B with Hepatitis Delta coinfection, MASLD       Subjective:      [name] [name] is a 52 y.o. female with Hepatitis B with Hepatitis Delta coinfection, as well as MASLD, with subsequent cirrhosis, who is referred for management.     The patient has been see by Dr. Kern Alberta Rojter. As detailed in note, '' In around 2020/2021, patient underwent abdominal ultrasound and was told had fatty liver. Work up as detailed below:     10/05/2021: ''AB [place]: fatty liver, no liver lesions, spleen 9.7cm''  02/12/2022:  AST 93, ALT 121, ALP 112  08/31/2022: PLT 215, AST 84, ALT 69, ALP 164, Tb 0.8, ceruloplasmin 37, Hepatitis B surface Ag positive, Hepatitis B core Ab positive, HCV Ab negative, A1AT MM  10/04/2022 Fibroscan 22.8kPA, CAP 386  10/24/2022 AB [place]: fatty liver, no hepatic lesions, spleen 9.7cm, no ascites   11/09/2022: AST 51, ALT 46, ALP 172, Tb 0.8, Hepatitis B e Ag negative, HBV PCR negative, HDV Ab positive     With today's encounter, the patient is accompanied by her son. Offered a [employer] officer, trade union but declined and her son offered to interpret. Unintentionally lost about 7 lbs. Has occasional abdominal discomfort. Sometimes loses appetite. No blood in stool. No jaundice. No pruritus. Not working with a dietician at this time.     Last colonoscopy 3 years ago.     Meal recall:   Beans, meats, veggie, cheese    Exercise: walking     Alcohol: previously drank 3 cups of wine or a few shots on a celebratory occasion in a month. Last use was distantly and stopped upon learning of diagnosis.     Supplements: none    Past Medical History   Hepatitis B   Hepatitis D Ab  MASLD  HTN  HLD  Arthritis: will be evaluated for rheumatologic conditions given dry eye and dry mouth    Family History  Brother: liver disease  Uncle: died from liver disease    Social History     Socioeconomic History    Marital status: Married     No outpatient medications prior to visit.     No facility-administered medications prior to visit.     Not on File    Review of Systems:   14 point Complete ROS is negative except for above      Objective:      BP 131/74  ~ Pulse 72  ~ Temp 36.8 ?C (98.3 ?F) (Forehead)  ~ Wt 152 lb (68.9 kg)  ~ SpO2 97%   Gen: NAD, AAO4  HEENT: no scleral icterus  AB: central fat deposition, nontender, nondistended, no hepatomegaly, no splenomegaly, no ascites  Ext: no peripheral edema  MSK: well nourished  NEURO: no asterixis  SKIN: no jaundice, no spider angioma, no palmer erythema    Lab Review:   02/12/2022  AST 93, ALT 121, ALP 112, Tb 0.8    08/31/2022  PLT 215  AST 84, ALT 69, ALP 164, Tb 0.8  Hepatitis A Ab reactive  Hepatitis B surface Ag reactive   Hepatitis B core AB reactive  HCV AB negative  TS 19%  SMA negative  Ceruloplasmin 37    11/09/2022  PLT 157  Creatinine 0.64, AST 51, ALT 46, ALP 172, Tb 0.8  Hepatitis B e Ag negative, Hepatitis B e AB reactive  HBV PCR negative   Hepatitis Delta Ab positive   HIV negative  AFP L3 10.3%    Imaging notable for:   AB [place] 10/05/2021  Hyperechoic and heterogenous echotexture of the liver. No intrahepatic lesions  Spleen is 9.7cm  Impression  Fatty infiltration of the liver     Fibroscan 10/04/2022  CAP 286, 22.8kPA    AB [place] 10/24/2022  Hyperechoic and heterogenous echotexture of the liver. No intrahepatic lesions   Spleen is normal at 9.7cm  Impression:   Fatty infiltration of the liver        Assessment & Plan:   [name] [name] is a 52 y.o. female with Hepatitis B with Hepatitis Delta coinfection, as well as MASLD, with subsequent cirrhosis, who is referred for management.     # Chronic Hepatitis B   # MASLD  The patient has exhibited elevated liver tests with hepatocellular pattern over time. She has chronic hepatitis B albeit the HBV PCR level remains relatively low <2000. She is noted to have Hepatitis Delta Ab, however, PCR is not known at this time. She does not have Hepatitis C or HIV. OSH Fibroscan 10/2022 notable for 22.8kPA for CAP286 raising concern for cirrhosis. She does not have a history of hepatic decompensation. She also concurrent risk factors for MASLD so will have to distinguish abnormal liver tests related to MASLD versus Hepatitis Delta. Elevated AFP L3 based on recent labs.     Agree with Dr. Earlie Counts work up to date. Recommend Hepatitis B eAg/eAg, HBV PCR every 3 months.  Dr. Lorenso Quarry has already provided patient with send out lab slip for Hepatitis Delta PCR which will be valuable in distinguishing active infection from prior exposure.  We discussed that given concern for advanced fibrosis/cirrhosis, there may be little harm in considering anti viral options such as entecavir or tenofovir as both are well tolerated.  If Hepatitis Delta PCR is detectable, interferon therapy would be considered but co-morbidities including autoimmune disease and CHF should be excluded. No obvious features of CHF at this time.   Recommend lifestyle modifications including avoidance of alcohol, diet and exercise with goal toward weight reduction of at least 10% of body weight to observe improvement in liver tests and any steatosis or fibrosis.   Continue to monitor liver chemistry tests over time  Recommend abdominal imaging every 6 months along with AFP. CT ab pelvis ordered.   Referral to Clinical Nutrition  Avoid alcohol   Avoid herbal supplements  Confirm immunity to Hepatitis A and B. If susceptible, recommend vaccination.     # Abdominal pain   # Unintentional weight loss  The patient reports periodic abdominal pain, decreased appetite. Describes possible family history of stomach cancer.   CT ab pelvis  EGD with biopsies for H. Pylori     Follow up: 2 months            Rae Lips, MD  Clinical Instructor, [employer]  Transplant Hepatologist  [facility], [facility], [place]      Author:  Rae Lips 01/04/2023 11:57 AM

## 2023-01-04 NOTE — Telephone Encounter
Received medical records and uploaded to careconnect

## 2023-01-04 NOTE — Patient Instructions
It was a pleasure meeting with you today.   Labs  CT ab pelvis  Endoscopy  Referral to Clinical Nutrition   Avoid alcohol  Avoid herbal supplements  Tylenol can be used for pain or fevers but should not exceed 2000mg  per day  Please note that we will make our best effort to contact you by phone regarding urgent concerns and critical diagnostic testing  [facility] messaging is intended for the use of basic and simple correspondence pertaining to non urgent matters.   If you have complex concerns or multiple questions, please set up an appointment to review

## 2023-01-07 ENCOUNTER — Ambulatory Visit: Payer: PRIVATE HEALTH INSURANCE

## 2023-01-07 DIAGNOSIS — B181 Chronic viral hepatitis B without delta-agent: Secondary | ICD-10-CM

## 2023-01-21 ENCOUNTER — Telehealth: Payer: PRIVATE HEALTH INSURANCE

## 2023-01-21 NOTE — Telephone Encounter
I am unclear on what that has to do with a colonoscopy. Is she asking for screening of colon cancer or related to the recent hospitalization?

## 2023-01-21 NOTE — Telephone Encounter
-----   Message from Rae Lips, MD, MS sent at 01/21/2023  7:53 AM PDT -----  Hello,     Please inform patient that Hepatitis B was negative but that Hepatitis D was positive as Dr. Lorenso Quarry had previously discovered. She was going to get labs done at [facility] which I would like an update on.     Thanks

## 2023-01-21 NOTE — Telephone Encounter
Called and relayed Dr. Maudry Mayhew message to patient.  Patient verbalized understanding.      Patient has an endoscopy schedule on 05/07/23 with Dr. Elita Boone and would like to know if Dr. Bethann Humble can place an order for colonoscopy.   Per patient she was admitted to [facility] on 04/15- 04/20 patient states she was informed by her PCP her gallbladder is inflamed and has gastritis       Requested ED notes from [facility]

## 2023-01-22 NOTE — Telephone Encounter
[employer] [phone]  2nd request for records

## 2023-01-22 NOTE — Telephone Encounter
Ok perhaps let's start with the discharge summary. Maybe they mean endoscopy.

## 2023-01-23 ENCOUNTER — Telehealth: Payer: PRIVATE HEALTH INSURANCE

## 2023-01-23 ENCOUNTER — Ambulatory Visit: Payer: PRIVATE HEALTH INSURANCE

## 2023-01-23 DIAGNOSIS — K529 Noninfective gastroenteritis and colitis, unspecified: Secondary | ICD-10-CM

## 2023-01-23 MED ORDER — PEG 3350-KCL-NABCB-NACL-NASULF 240 G PO SOLR
0 refills | Status: AC
Start: 2023-01-23 — End: ?

## 2023-01-23 NOTE — Telephone Encounter
Ok thanks.   Sounds like she had an endoscopy 4/19 with gastritis but that's not the reason for the colonoscopy.     Reason for colonoscopy request is abnormal imaging of the colon with inflammation of colon and appendix. I have placed an order if this can be scheduled either at [facility] or with local provider.     We will need a copy of endoscopy report 01/18/2023 and CDs of CT ab pelvis and abdominal ultrasound.     Thanks

## 2023-01-23 NOTE — Telephone Encounter
Called and spoke with patient relayed Dr. Maudry Mayhew message and provided her with MPu scheduling info.      Requested CDs and endoscopy report from [facility]

## 2023-01-23 NOTE — Telephone Encounter
MPU Checklist    Procedure Scheduled:   Colonoscopy    Prep instructions have been delivered to patient via:   Mail to address- Specify Prep    Encounter sent to FCU team if the following applies:   Adding on an additional procedure    Sedation Method:   MAC- MAC script was given     MD/PREP: (open access pts)   N/A- Clinic Pt

## 2023-01-23 NOTE — Telephone Encounter
Message to Practice/Provider      Message: Please mail prep instructions in spanish for [facility] to address on file.     [address]  [place] [address]     Return call is not being requested by the patient or caller.    Patient or caller has been notified of the turnaround time of 1-2 business day(s).

## 2023-01-23 NOTE — Telephone Encounter
Good morning Carina,      We received the records requested below, I went ahead and upload it to chart under media.    Thank you

## 2023-02-18 NOTE — Telephone Encounter
Pt has been rescheduled to sooner date.

## 2023-02-27 ENCOUNTER — Ambulatory Visit: Payer: PRIVATE HEALTH INSURANCE | Attending: Student in an Organized Health Care Education/Training Program

## 2023-02-27 DIAGNOSIS — K760 Fatty (change of) liver, not elsewhere classified: Secondary | ICD-10-CM

## 2023-02-27 DIAGNOSIS — R1013 Epigastric pain: Secondary | ICD-10-CM

## 2023-02-27 DIAGNOSIS — R7989 Other specified abnormal findings of blood chemistry: Secondary | ICD-10-CM

## 2023-02-27 NOTE — Progress Notes
Outpatient Hepatology Follow up      PATIENT: [name] [name]  MRN: [id number]  DOB: September 27, 1971  DATE OF SERVICE: 02/27/2023  REFERRING PRACTITIONER: Rae Lips, MD, MS  PRIMARY CARE PROVIDER: Rojter, Mirian Capuchin, MD  REASON FOR REFERRAL: abnormal Hepatitis B labs  CHIEF COMPLAINT:  abnormal Hepatitis B labs     Subjective:      [name] [name] is a 52 y.o. female with MASLD,who is referred for abnormal Hepatitis B labs.     The patient has been see by Dr. Kern Alberta Rojter. As detailed in note, '' In around 2020/2021, patient underwent abdominal ultrasound and was told had fatty liver. Work up as detailed below:     10/05/2021: ''AB [place]: fatty liver, no liver lesions, spleen 9.7cm''  02/12/2022:  AST 93, ALT 121, ALP 112  08/31/2022: PLT 215, AST 84, ALT 69, ALP 164, Tb 0.8, ceruloplasmin 37, Hepatitis B surface Ag positive, Hepatitis B core Ab positive, HCV Ab negative, A1AT MM  10/04/2022 Fibroscan 22.8kPA, CAP 386  10/24/2022 AB [place]: fatty liver, no hepatic lesions, spleen 9.7cm, no ascites   11/09/2022: AST 51, ALT 46, ALP 172, Tb 0.8, Hepatitis B e Ag negative, HBV PCR negative, HDV Ab positive   Met with Dr. Lorenso Quarry to complete HDV PCR labs but I do not have the results.   OSH hospitalization 4/15-4/20/2024 for left sided abdominal pain and left flank pain associated with watery diarrhea. Underwent CT ab pelvis which described prominent appendix and thickening of cecum. Also underwent an EGD but do not have the results. Notes mention gastritis and gastric polyp  Weight was 152lbs  on 01/04/2023 and now 145lbs.     With today's encounter, the patient is accompanied by her daughter. Says diarrhea has resolved. Still has occasional epigastric pain. Daughter thinks she is eating healthier    Meal recall:   Beans, meats, veggie, cheese    Exercise: walking     Alcohol: previously drank 3 cups of wine or a few shots on a celebratory occasion in a month. Last use was distantly and stopped upon learning of diagnosis.     Supplements: none    Family History  Brother: liver disease  Uncle: died from liver disease    Social History     Socioeconomic History    Marital status: Married   Tobacco Use    Smoking status: Never    Smokeless tobacco: Never   Substance and Sexual Activity    Alcohol use: Never    Drug use: Never     Social Determinants of Health     Physical Activity: Not at Risk (01/21/2023)    Received from [place] , [place]     Physical Activity     Physical Activity: 1   Stress: Not at Risk (01/21/2023)    Received from [place] , [place]     Stress     Stress: 1   Financial Resource Strain: Not at Risk (01/21/2023)    Received from [place] , [employer]     Financial Resource Strain: 1     Outpatient Medications Prior to Visit   Medication Sig    amLODIPine 5 mg tablet Take 1 tablet (5 mg total) by mouth.    atorvastatin 20 mg tablet Take 1 tablet (20 mg total) by mouth.    loratadine 10 mg tablet Take 1 tablet (10 mg total) by mouth.    losartan 100 mg tablet Take 1 tablet (100 mg total)  by mouth.    LUMIGAN 0.01 % ophthalmic solution Place 1 drop into the right eye.    polyethylene glycol 240 g solution - At 6pm the day before colonoscopy, drink 1/2 the solution: one 8oz glass every 10 minutes - At 5am the day of colonoscopy, drink one 8oz glass every 10 minutes until done. (Patient not taking: Reported on 02/27/2023.)     No facility-administered medications prior to visit.     Allergies   Allergen Reactions    Lisinopril Other (See Comments)     Headache x 5 months with medication    Hydrochlorothiazide        Review of Systems:   14 point Complete ROS is negative except for above      Objective:      BP 129/76  ~ Pulse 87  ~ Temp 36.6 ?C (97.9 ?F) (Forehead)  ~ Ht 5' 2'' (1.575 m)  ~ Wt 145 lb (65.8 kg)  ~ BMI 26.52 kg/m?   Gen: NAD, AAO4  HEENT: no scleral icterus  AB: central fat deposition, nontender, nondistended, no hepatomegaly, no splenomegaly, no ascites  Ext: no peripheral edema  MSK: well nourished  SKIN: no jaundice, no spider angioma, no palmer erythema    Lab Review:   02/12/2022  AST 93, ALT 121, ALP 112, Tb 0.8    08/31/2022  PLT 215  AST 84, ALT 69, ALP 164, Tb 0.8  Hepatitis A Ab reactive  Hepatitis B surface Ag reactive   Hepatitis B core AB reactive  HCV AB negative  TS 19%  SMA negative  Ceruloplasmin 37    11/09/2022  PLT 157  Creatinine 0.64, AST 51, ALT 46, ALP 172, Tb 0.8  Hepatitis B e Ag negative, Hepatitis B e AB nonreactive  HBV PCR negative   Hepatitis Delta Ab positive   HIV negative  AFP L3 10.3%    01/04/2023  Hepatitis B core Ab negative  AMA <1:20    01/14/2023  WBC 3.5, Hb 11.2, PLT 216  AST 40, ALT 34, ALP 157, Tb 0.7  INR 0.99    01/15/2023  AST 40, ALT 36, ALP 162, Tb 0.8    01/17/2023  Lipase 74  Hepatitis B surface Ag negative  Hepatitis B e Ag negative  Hepatitis B eAb negative  HBV PCR negative   Hepatitis Delta IgM abnormal   HCV AB negative  AMA negative  TS 13%    01/19/2023  Na 138, creatinine 0.7    Imaging notable for:   AB [place] 10/05/2021  Hyperechoic and heterogenous echotexture of the liver. No intrahepatic lesions  Spleen is 9.7cm  Impression  Fatty infiltration of the liver     Fibroscan 10/04/2022  CAP 286, 22.8kPA    AB [place] 10/24/2022  Hyperechoic and heterogenous echotexture of the liver. No intrahepatic lesions   Spleen is normal at 9.7cm  Impression:   Fatty infiltration of the liver     CT ab pelvis 01/14/2023  Prominent appendix without inflammatory changes. Acute appendicitis not excluded.   Thickening of the cecum through hepatic flexure without associated inflammatory changes. May represent colitis.     AB [place] 01/15/2023  Liver demonstrates heterogenous attenuating echotexture spanning 15.1cm in greatest long axis dimensions. There is no intrahepatic biliary dilation. The portal vein is patent with normal directional flow. The common duct measures 3mm within normal limits. The gallbladder is unremarkable.     Limited evaluation of the pancreas, IVC, aorta  No ascites.  Assessment & Plan:   [name] [name] is a 52 y.o. female with MASLD,who is referred for abnormal Hepatitis B labs.     # Abnormal liver tests  # MASLD  The patient has exhibited elevated liver tests with hepatocellular pattern over time. While there was an initial concern for Hepatitis B based on serologies 08/2022 and query of Hepatitis D, it appears that repeat labs by 12/2022 was not consistent with Hepatitis B, and as such making Hepatitis D unlikely. Unclear if she had a transient Hepatitis B exposure. OSH Fibroscan 10/2022 notable for 22.8kPA for CAP286 raising concern for cirrhosis. She also concurrent risk factors for MASLD so will have to distinguish abnormal liver tests related to MASLD. Other considerations include AMA negative PBC. She does not have a history of hepatic decompensation.   Agree with Dr. Earlie Counts work up to date.   Retrieve records from recent labs regarding Hepatitis Delta PCR. Repeat Hepatitis B serologies. If negative, then Hepatitis Delta is likely false positive. Can either continue to see Dr. Lorenso Quarry or myself if Hepatitis serologies are negative.   If not Hepatitis B immune, would recommend vaccination.   Recommend lifestyle modifications including avoidance of alcohol, diet and exercise with goal toward weight reduction of at least 10% of body weight to observe improvement in liver tests and any steatosis or fibrosis.   Continue to monitor liver chemistry tests over time  Recommend abdominal imaging every 6 months along with AFP. In light of elevated AFP L3 and single phase CT ab pelvis done while hospitalized which would be insufficient to screen for [facility], recommend CT ab pelvis   Avoid alcohol   Avoid herbal supplement    # Abdominal pain   CT ab pelvis in hospital describe prominent appendix and inflammation near cecum. Symptoms have improved but epigastric pain has not resolved. Diarrhea has resolved. Underwent EGD reportedly with gastritis but records not available.   Obtain CT ab pelvis to confirm resolution of findings noted while hospitalized  Retrieve records of endoscopy from hospitalization. Schedule for endoscopy/colonoscopy but likely does not need repeat endoscopy if records obtained.  Referral to Gastroenterology     Follow up: 3 months       37 minutes were spent personally by me today on this encounter which include today's pre-visit review of the chart, obtaining appropriate history, performing an evaluation, documentation and discussion of management with details supported within the note for today's visit. The time documented was exclusive of any time spent on any separately billed procedure.  Thank you for the opportunity to participate in the care of this patient. Please feel free to contact me with any questions or concerns.      Rae Lips, MD  Clinical Instructor, [employer]  Transplant Hepatologist  [facility], [facility], [place]

## 2023-02-27 NOTE — Patient Instructions
It was a pleasure meeting with you today.   Labs  Schedule CT ab pelvis  Referral to Gastroenterology   Will retrieve records of endoscopy and Dr. Marylouise Stacks note  Avoid alcohol  Avoid herbal supplements  Tylenol can be used for pain or fevers but should not exceed 2000mg  per day  Please note that we will make our best effort to contact you by phone regarding urgent concerns and critical diagnostic testing  [facility] messaging is intended for the use of basic and simple correspondence pertaining to non urgent matters.   If you have complex concerns or multiple questions, please set up an appointment to review  If you choose to obtain diagnostic testing outside of [facility], it is your responsibility to communicate with the office about forwarding your results.

## 2023-02-28 ENCOUNTER — Telehealth: Payer: PRIVATE HEALTH INSURANCE

## 2023-02-28 NOTE — Telephone Encounter
-----   Message from Rae Lips, MD, MS sent at 02/28/2023  7:55 AM PDT -----  Hello,     Please inform patient that liver tests are slightly improved.   Hepatitis B labs are negative so I recommend vaccination with PCP.     Rae Lips, MD

## 2023-02-28 NOTE — Telephone Encounter
Called and informed patient per Dr. Bethann Humble liver test are slightly improved Hepatitis B labs are negative she recommends vaccination with PCP.    No further questions asked

## 2023-02-28 NOTE — Telephone Encounter
Double Procedure compressed to single slot per MD/Order.    OK per Dr. Minette Brine, and MPU/Sharona to accommodate     FOR DATE: 06/07    Please look at message below .Marland KitchenMarland KitchenMarland Kitchen

## 2023-02-28 NOTE — Telephone Encounter
-----   Message from Matthew Saras, MD sent at 02/28/2023  5:11 AM PDT -----  Thanks Jeremy Johann can we compress this to a 30 min appointment and leave it blocked for a clinic pt?   Thanks Romeo Apple  ----- Message -----  From: Rae Lips, MD, MS  Sent: 02/27/2023   4:00 PM PDT  To: Matthew Saras, MD    [facility] Romeo Apple,     This patient is scheduled for scope with you early June for EGD/Colonoscopy. The endoscopy was originally placed for variceal screening but sounds like she had an EGD while hospitalized after the fact. I will try to get the records so she might not need another hopefully, unless there was an intervention. My understand was that she had gastritis but not sure if biopsies done.     Anyway, I will be out of the office and just wanted to let you know in case I do not get the results back in time.     Lavenia Atlas

## 2023-03-01 ENCOUNTER — Telehealth: Payer: PRIVATE HEALTH INSURANCE

## 2023-03-01 NOTE — Telephone Encounter
EGD has been removed from scheduled procedure per MD's request.

## 2023-03-01 NOTE — Telephone Encounter
-----   Message from Matthew Saras, MD sent at 03/01/2023  7:56 AM PDT -----  [name]   Can we take the EGD off this double?  Thanks   [name] [name]  ----- Message -----  From: Rae Lips, MD, MS  Sent: 03/01/2023   7:26 AM PDT  To: Matthew Saras, MD    Ok no problem  ----- Message -----  From: Matthew Saras., MD  Sent: 03/01/2023   3:24 AM PDT  To: Rae Lips, MD, MS    Looks like they recommended repeat egd in a year - ok to cancel this part of the double? Thanks   [employer]

## 2023-03-01 NOTE — Telephone Encounter
Confirmation Documentation   Patient is scheduled on (DATE) for: 06/07 @ 9:30am   [x]  Colonoscopy   []  Upper Endoscopy   []  Pouchoscopy   []  Upper Endoscopy w/Bravo   []  Upper Endoscopy w/ Esophageal Manometry   []  Upper Endoscopy w/ Esophageal Manometry & pH   []  Upper Endoscopy w/Endoflip   []  Sigmoidoscopy   []  Anoscopy   []  Illeoscopy   []  Small Bowel Enteroscopy   []  Esophageal Manometry   []  Anorectal Manometry   []  pH Study   []  Capsule Endoscopy    Informed patient of the following:   [x]  Arrival time   [x]  Location including suite number   [x]  Transportation   [x]  [employer]   [x]  Does patient have prep instructions?   [x]  Informed patient to call back should there be any questions?   [x]  Does procedure order match the case scheduled    NOTE: If patients have any questions regarding prescribed medication patient needs to contact their prescribing physician to ensure it is ok to stop medications.

## 2023-03-08 MED ADMIN — PROPOFOL 200 MG/20ML IV EMUL (ANES): INTRAVENOUS | @ 17:00:00 | Stop: 2023-03-08

## 2023-03-08 MED ADMIN — SODIUM CHLORIDE 0.9 % IV SOLN: INTRAVENOUS | @ 17:00:00 | Stop: 2023-03-08 | NDC 00338004904

## 2023-03-08 MED ADMIN — LIDOCAINE HCL (PF) 1 % IJ SOLN: INTRAVENOUS | @ 17:00:00 | Stop: 2023-03-08 | NDC 63323049257

## 2023-03-08 MED ADMIN — PROPOFOL 200 MG/20ML IV EMUL: INTRAVENOUS | @ 17:00:00 | Stop: 2023-03-08 | NDC 63323026929

## 2023-03-08 MED ADMIN — SODIUM CHLORIDE 0.9 % IV SOLN: INTRAVENOUS | @ 17:00:00 | Stop: 2023-03-08

## 2023-03-08 NOTE — Nursing Note
Translator used.   

## 2023-03-08 NOTE — Discharge Instructions
DISCHARGE INSTRUCTIONS    PROCEDURE: []  Upper endoscopy (EGD)   [x]  Colonoscopy    []  Sigmoidoscopy    FINDINGS:   One small polyp removed   Diverticulosis     Due to the medications you received during your procedure, you may experience lightheadedness, dizziness, or sleepiness.  The effects of the medication you received can last until the following day.    If there is discomfort at your I.V. Site, apply a warm moist towel on the site until the soreness subsides.  If the discomfort persists for more than 48 hours, contact your physician.    You may experience a ''bloated'' feeling due to the air that was introduced into your body during the procedure.  You may feel more comfortable once you pass the air.  You may try drinking warm liquids or walking to help you pass the air.    If you had an upper endoscopy, a mild sore throat is not uncommon after the procedure.  You may try gargling or drinking warm liquids for relief. Over the counter sore throat lozenges may also be helpful, follow the package instructions.     ACTIVITY:    Avoid any strenuous physical activity for 24 hours  No driving or operating heavy machinery for 24 hours  No alcoholic beverages for 24 hours (may interact with some medications you received during your procedure)  Do not sign any legal documents after you leave or make any important personal or business decisions until the following day     INSTRUCTIONS:    [x]  Resume your usual diet    []  Specific diet instructions:     [x]  Resume your medications    []  Specific medication instructions:    []  No aspirin or NSAIDS (non-steroidal anti-inflammatory drugs) such as advil, motrin, ibuprofen, aleve, naprosyn, mobic, etc. for at least 10 days        Call [phone] and notify Dr. Riley Kill if you experience any of the following:    Severe chest pain, difficulty breathing  Passage of blood clots, black tarry stools, vomiting blood  Severe inflammation at the I.V. site  Difficulty swallowing or persistent vomiting  Abdominal pain  Chills or fever greater than 101 F or 38.4 C within 24 hours of procedure    FOLLOW-UP CARE:    Contact Dr. Riley Kill if you do not receive biopsy results (in the ''Test Results'' section of [facility]) within 10 days after the procedure.        INSTRUCCIONES DE DESCARGA    PROCEDIMIENTO: [ ]  Endoscopia superior (EGD) [x]  Colonoscopia [ ]  Sigmoidoscopia    RECOMENDACIONES:   Se extirp? un peque?o p?lipo.   diverticulosis     Debido a los medicamentos que recibi? durante el procedimiento, puede experimentar aturdimiento, mareos o somnolencia.  Los efectos de la medicaci?n que recibi? pueden durar hasta el d?a siguiente.    Si hay molestias en su I.V. Sitio, aplique una toalla h?meda y tibia en el sitio hasta que el dolor desaparezca.  Si el malestar persiste por m?s de 48 horas, contacte a su m?dico.    Es posible que experimente una sensaci?n de ''hinchaz?n'' debido al aire que se introdujo en su cuerpo durante el procedimiento.  Es posible que se sienta m?s c?modo una vez que pase el aire.  Puede intentar beber l?quidos tibios o caminar para ayudarle a evacuar el aire.    Si le hicieron una endoscopia superior, no es infrecuente sentir un leve dolor de garganta despu?s  del procedimiento.  Puede intentar hacer g?rgaras o beber l?quidos tibios para aliviarlo. Las pastillas para Chief Technology Officer de garganta de venta libre tambi?n pueden ser ?tiles; siga las instrucciones del paquete.     ACTIVIDAD:    Evite cualquier actividad f?sica extenuante durante 24 horas.  No conducir ni operar maquinaria pesada durante 24 horas.  No beber bebidas alcoh?licas durante 24 horas (puede interactuar con algunos medicamentos que recibi? durante su procedimiento)  No firme ning?n documento legal despu?s de irse ni tome decisiones personales o comerciales importantes hasta el d?a siguiente.     INSTRUCCIONES:    [x]  Reanuda tu dieta habitual    []  Instrucciones de dieta espec?ficas:     [x]  Reanudar sus medicamentos    [ ]  Instrucciones de medicaci?n espec?ficas:    [ ]  No aspirina ni AINE (medicamentos antiinflamatorios no esteroideos) como advil, motrin, ibuprofeno, aleve, naprosyn, mobic, etc. durante al menos 10 d?as.        Llame al [phone] y notifique al Dr. Riley Kill si experimenta alguno de los siguientes:    Dolor intenso en el pecho, dificultad para respirar.  Expulsi?n de co?gulos de sangre, heces negras y alquitranadas, v?mitos con sangre.  Inflamaci?n severa en la v?a I.V. sitio  Dificultad para tragar o v?mitos persistentes.  Dolor abdominal  Escalofr?os o fiebre superior a 101 F o 38,4 C dentro de las 24 horas posteriores al procedimiento    ATENCI?N DE SEGUIMIENTO:    Comun?quese con el Dr. Riley Kill si no recibe los resultados de la biopsia (en la secci?n ''Resultados de la prueba'' de Research officer, political party) dentro de los 10 d?as posteriores al procedimiento.    IV SEDATION DISCHARGE INSTRUCTIONS ADULT    DIET: Your diet is based on your preference  Begin with clear liquids such as soda, apple juice, tea, etc.  DO NOT use a straw for drinking as  this increases the amount of air swallowed and can increase bloating and stomach distress.    If liquids are tolerated, progress slowly to your usual diet.    NOTE: Intermittent nausea, with or without vomiting, is a common side effect following surgery with intravenous sedation.  If this occurs, DO NOT eat or drink anything until it has subsided.  If the nausea or vomiting persists for more than 24 hours contact your physician or go to the nearest emergency room.    ACTIVITY: You may gradually return to your usual activity if not restricted by your surgeon. Sedative medications may linger in the body for 24-48 hours.  It is not uncommon to feel somewhat drowsy and/or dizzy the remainder of the day following surgery.  Avoid bending over as this may enhance dizziness.  Do not drive an automobile or operate any heavy machinery for 24 hours.  Do not make sudden movements when changing position, such as from a reclining to upright position.  Do not sign any legal documents or make any important decisions for 24 hours. Do not drink alcohol for 24 hours.    ADDITIONAL INSTRUCTIONS: It is not uncommon to tire easily and/or feel exhausted the day following surgery.  Do not be alarmed as this is normal.  Plan activities that will allow for rest periods as needed.    For your safety, we strongly advise that a responsible adult remain with you for the rest of thesurgical day and night.  Also, you should not be responsible for the care of others  (children/adults) for 24 hours post anesthesia and a responsible adult  should be present to assume these responsibilities.    You should take several deep breaths and give several forceful coughs every hour during the initial post-operative day.  This will help to clear mucus and keep the lungs appropriately inflated.    If you need advice or have any questions regarding anesthesia, please call the appropriate number and ask to speak to an available anesthesiologist:    [phone] : [id number] until 5pm, [phone]: After-hours or Weekends/Holidays  (This is the [facility]; ask to have the faculty anesthesiologist paged)

## 2023-03-08 NOTE — Procedures
PATIENT NAME: [name] [name], [name] [name]  DATE OF BIRTH: 09-Mar-1971  RECORD NUMBER: [id number]  DATE/TIME OF PROCEDURE: 03/08/2023 / 09:30:00 AM  ENDOSCOPIST: Levora Dredge, MD  REFERRING PHYSICIAN:   FELLOW:     INDICATIONS FOR EXAMINATION: Screening Colonoscopy.               PROCEDURE PERFORMED: COLONOSCOPY    MEDICATIONS:    MAC anesthesia    PROCEDURE TECHNIQUE:  Informed consent obtained for the procedure including risks, benefits and alternatives and the risk of those alternatives. Informed consent obtained for sedation including risks, benefits and alternatives and the risk of those alternatives. Pulse, pulse   oximetry, and blood pressure were monitored throughout the procedure. Time out was performed in the room prior to starting the procedure. The endoscope passed with ease through the anus under direct visualization; it was advanced to the cecum, confirmed   by the appendiceal orifice and ileocecal valve. The scope was withdrawn and the mucosa was carefully examined. The views were good. The patient's toleration of the procedure was good. Retroflexion was performed in the rectum.    Total Withdrawl Time:   Total Insertion Time:                              EXTENT OF EXAM:  terminal ileum            INSTRUMENTS:     TECHNICAL DIFFICULTY:  No   LIMITATIONS:      None TOLERANCE:   Good  VISUALIZATION:  Good  COMPLETENESS: Yes           RETROFLEXTION: Yes  BBPS: LEFT:           RIGHT:          TRANSVERSE:             TOTAL SCORE:  0    FINDINGS:   Normal TI. Diminutive transverse colon polyp removed with cold forceps polypectomy. Sigmoid diverticulosis. The colonoscopy examination was otherwise normal.    ESTIMATED BLOOD LOSS:   None     DIAGNOSIS:  One sub-CM polyp removed   Diverticulosis    RECOMMENDATIONS:  Follow up pathology            This electronic signature authenticates all electronic and/or handwritten documentation, including orders, generated by the signer during the episode of care contained in this record.  03/08/2023 10:08:45 AM By Levora Dredge

## 2023-03-08 NOTE — H&P
Procedure: []  EGD  [x]  Colonoscopy  []  PEG  []  Enteroscopy  []  Flexible Sigmoidoscopy                     []  Other:    Level of sedation intended for procedure: []  Moderate  [x]  MAC  []  Anesthesia    Informed Consent Obtained Including Risks, Benefits & Alternatives:  [x]  Yes    Informed Consent Obtained For Sedation Including Risks, Benefits & Alternatives: [x]  Yes    History:    Chief Complaint / Indication:  CRC screening     Allergies:   Allergies   Allergen Reactions    Lisinopril Other (See Comments)     Headache x 5 months with medication    Hydrochlorothiazide        Current Medication: Medication record reviewed  No outpatient medications have been marked as taking for the 03/08/23 encounter [facility] Encounter).     No current facility-administered medications for this encounter.       Past Medical History: History reviewed. No pertinent past medical history.     Family History: family history is not on file.    Social History:  reports that she has never smoked. She has never used smokeless tobacco. She reports that she does not drink alcohol and does not use drugs.    Past Surgical History: History reviewed. No pertinent surgical history.     Physical Exam:    Vitals Signs:   Last Recorded Vital Signs:    03/08/23 0900   BP: 145/76   Pulse: 73   Resp: 20   Temp: 36.4 ?C (97.6 ?F)   SpO2: 99%       There is no height or weight on file to calculate BMI.  There were no vitals filed for this visit.      Normal Exam                                     Additional Findings  [x]  General  [x]  HEENT  [x]  Heart / CVS  [x]  Lungs / Respiratory  [x]  Abdomen      Airway Assessment:    [place] Visualized: []  Yes  []  Partially  []  Not at all  Neck ROM: [x]  Normal  []  Limited  Sleep apnea confirmed by sleep study or on CPAP at home: [x]  No  []  Yes      ASA Classification: ASA 2 - Patient with mild systemic disease with no functional limitations    I have reviewed the history and physical and have determined [name] [name] to be an appropriate candidate to undergo the planned procedure with sedation and analgesia.

## 2023-03-13 ENCOUNTER — Ambulatory Visit: Payer: PRIVATE HEALTH INSURANCE | Attending: Student in an Organized Health Care Education/Training Program

## 2023-03-18 LAB — Tissue Exam

## 2023-03-21 DIAGNOSIS — L739 Follicular disorder, unspecified: Secondary | ICD-10-CM | POA: Diagnosis not present

## 2023-03-21 DIAGNOSIS — Z1389 Encounter for screening for other disorder: Secondary | ICD-10-CM | POA: Diagnosis not present

## 2023-03-21 DIAGNOSIS — Z0000 Encounter for general adult medical examination without abnormal findings: Secondary | ICD-10-CM | POA: Diagnosis not present

## 2023-03-21 DIAGNOSIS — E119 Type 2 diabetes mellitus without complications: Secondary | ICD-10-CM | POA: Diagnosis not present

## 2023-03-21 DIAGNOSIS — Z0131 Encounter for examination of blood pressure with abnormal findings: Secondary | ICD-10-CM | POA: Diagnosis not present

## 2023-03-21 DIAGNOSIS — I10 Essential (primary) hypertension: Secondary | ICD-10-CM | POA: Diagnosis not present

## 2023-03-21 DIAGNOSIS — E1165 Type 2 diabetes mellitus with hyperglycemia: Secondary | ICD-10-CM | POA: Diagnosis not present

## 2023-04-25 ENCOUNTER — Ambulatory Visit: Payer: PRIVATE HEALTH INSURANCE | Attending: Gastroenterology

## 2023-04-25 DIAGNOSIS — R109 Unspecified abdominal pain: Secondary | ICD-10-CM

## 2023-04-25 MED ORDER — POLYETHYLENE GLYCOL 3350 17 GM/SCOOP PO POWD
17 g | Freq: Every day | ORAL | 2 refills | 28.00000 days | Status: AC
Start: 2023-04-25 — End: ?

## 2023-04-25 NOTE — Patient Instructions
Stop omeprazole for two weeks   Go to lab [address] for breath test in 2 weeks - 7am-6pm on weekdays   Start miralax 1 cap daily in 8oz tea       1. Suspender el omeprazol [employer].   2. Vaya al [facility] [address] para una prueba de aliento en 2 semanas - 7 am-6 pm de lunes a viernes  3. Comience con miralax 1 tapa al d?a en t? de 8 oz

## 2023-04-25 NOTE — Consults
OUTPATIENT GASTROENTEROLOGY CONSULTATION    Date of service: 04/25/2023  Reason for consult: LUQ pain   Referring Provider: Rae Lips, MD, MS  PCP: Rojter, Mirian Capuchin, MD    HISTORY OF PRESENT ILLNESS:   52 y.o. female who presents for LUQ pain.     Chronic LUQ which became more severe at the beginning of this year.   Presented to outside hospital in April with pain and diarrhea. CT showed cecal wall thickening. Treated with ceftriaxone and flagyl. Reportedly also treated for pancreatitis although CT did not show pancreatitis.   Outside EGD showed H pylori gastritis. She reports that it was treated with antibiotics.   She reports constipation. Tries to manage with fruits. BM every day. However +straining and discomfort.   Colonoscopy in June was unremarkable.     PAST MEDICAL HISTORY:   No past medical history on file.    PAST SURGICAL HISTORY:   No past surgical history on file.    ALLERGIES:    Lisinopril and Hydrochlorothiazide    HOME MEDICATIONS:     Outpatient Medications Prior to Visit   Medication Sig    amLODIPine 5 mg tablet Take 1 tablet (5 mg total) by mouth.    atorvastatin 20 mg tablet Take 1 tablet (20 mg total) by mouth.    hydroxychloroquine 200 mg tablet Take 1 tablet (200 mg total) by mouth daily.    loratadine 10 mg tablet Take 1 tablet (10 mg total) by mouth.    losartan 100 mg tablet Take 1 tablet (100 mg total) by mouth.    LUMIGAN 0.01 % ophthalmic solution Place 1 drop into the right eye.    omeprazole 20 mg DR capsule Take 1 capsule (20 mg total) by mouth daily.    pilocarpine 5 mg tablet Take 1 tablet (5 mg total) by mouth three (3) times daily.    prednisoLONE acetate 1% ophthalmic suspension Place 1 drop into both eyes three (3) times daily.    predniSONE 5 mg tablet Take 1 tablet (5 mg total) by mouth.    polyethylene glycol 240 g solution - At 6pm the day before colonoscopy, drink 1/2 the solution: one 8oz glass every 10 minutes - At 5am the day of colonoscopy, drink one 8oz glass every 10 minutes until done. (Patient not taking: Reported on 02/27/2023.)     No facility-administered medications prior to visit.        FAMILY HISTORY:   No family history on file.    SOCIAL HISTORY:     Social History     Tobacco Use    Smoking status: Never    Smokeless tobacco: Never   Substance Use Topics    Alcohol use: Never    Drug use: Never       REVIEW OF SYSTEMS:   A 14- point review of systems was negative except where noted in the HPI.    PHYSICAL EXAM:   Ht 1.575 m (5' 2'')  ~ Wt 65.2 kg (143 lb 12.8 oz)  ~ BMI 26.30 kg/m?     Gen: No acute distress, answers questions appropriately.  Eyes: Anicteric sclera, no conjunctival pallor  Neck: Trachea midline, good range of motion  Resp: normal WOB   Abd: Normoactive bowel sounds, soft, LUQ ttp with radiation around to left upper flank, nondistended, no rebound or guarding  MSK: No peripheral edema, clubbing, or cyanosis   Neuro: Alert and oriented, grossly nonfocal, moving all extremities   Skin: Warm and well perfused,  no rashes  Psych: Normal mood and affect    LABORATORY DATA:     Lab Results   Component Value Date    WBC 4.11 (L) 02/27/2023    HGB 11.9 02/27/2023    HCT 37.3 02/27/2023    PLT 197 02/27/2023     Lab Results   Component Value Date    AST 76 (H) 02/27/2023    AST 92 (H) 01/04/2023    ALT 64 02/27/2023    ALT 79 (H) 01/04/2023    BILITOT 0.7 02/27/2023    BILITOT 0.6 01/04/2023    ALKPHOS 214 (H) 02/27/2023    ALKPHOS 253 (H) 01/04/2023    INR 1.0 02/27/2023    ALBUMIN 4.5 02/27/2023    AFP 4 01/04/2023      Lab Results   Component Value Date    CREAT 0.64 02/27/2023    BUN 17 02/27/2023    NA 138 02/27/2023    K 3.6 02/27/2023    CL 105 02/27/2023    CO2 21 02/27/2023    CALCIUM 9.5 02/27/2023     No results found for: ''HBVQPCR''  No results found for: ''HCVQPCR''    RADIOLOGY:     CT ab pelvis 01/14/2023:          ENDOSCOPY:     Colonoscopy 03/08/23:    FINDINGS:   Normal TI. Diminutive transverse colon polyp removed with cold forceps polypectomy. Sigmoid diverticulosis. The colonoscopy examination was otherwise normal.        COLON, TRANSVERSE, POLYP (BIOPSY):   - Colonic mucosa with prominent benign appearing lymphoid aggregate  - No dysplasia (multiple deeper levels examined)            ASSESSMENT/RECOMMENDATIONS:     Problem List Items Addressed This Visit    None  Visit Diagnoses       Left sided abdominal pain    -  Primary          LUQ/flank pain. History of H pylori per outside records, although path report not available. Will stop PPI x2 weeks and check breath test to determine if she still has H pylori. Constipation may also be contributing. Will trial miralax 1 cap daily. Colonoscopy was unremarkable.       Orders Placed This Encounter    H Pylori Urea Breath Test    polyethylene glycol powder       Levora Dredge, MD   [facility]     Problem:   []  New minor/self-limited problem  []  Acute uncomplicated illness/injury   []   Acute systemic illness or complicated injury   []   New problem with uncertain diagnosis  []  New problem that poses threat to life or bodily function  []   2 chronic stable problems   [x]   1 chronic unstable problem     Review of Data:   I have   [x]  Reviewed/ordered []  1 []  2 [x]  ? 3 unique laboratory, radiology, and/or diagnostic tests noted above   []  Reviewed []  1 []  2 []  ? 3 prior external notes and incorporated into patient assessment   []  Discussed management or test interpretation with external provider(s) as noted     Risk of Complication:   This Clinical research associate has deemed the above diagnoses to have a risk of complication, morbidity or mortality of:   []  Minimal;    [x]  Low;     []  Moderate;     []  Severe

## 2023-05-14 ENCOUNTER — Telehealth: Payer: PRIVATE HEALTH INSURANCE

## 2023-05-14 NOTE — Telephone Encounter
-----   Message from Matthew Saras, MD sent at 05/14/2023  7:58 AM PDT -----  Can you please let her know that H pylori was negative, and if she continues to have pain we should follow up? Thanks Bne

## 2023-05-14 NOTE — Telephone Encounter
MD message was relayed to patient. Patient verbalized understanding. No further questions asked.

## 2023-05-24 ENCOUNTER — Ambulatory Visit: Payer: PRIVATE HEALTH INSURANCE | Attending: Student in an Organized Health Care Education/Training Program

## 2023-05-24 DIAGNOSIS — K760 Fatty (change of) liver, not elsewhere classified: Secondary | ICD-10-CM

## 2023-05-24 DIAGNOSIS — R932 Abnormal findings on diagnostic imaging of liver and biliary tract: Secondary | ICD-10-CM

## 2023-05-24 NOTE — Patient Instructions
It was a pleasure meeting with you today.   Labs  Schedule CT ab pelvis  Retrieve records of Rheumatology   Referral to Clinical Nutrition for assistance with weight loss. Referral for Clinical Nutrition, call [phone]  Avoid alcohol  Avoid herbal supplements  Tylenol can be used for pain or fevers but should not exceed 2000mg  per day  Please note that we will make our best effort to contact you by phone regarding urgent concerns and critical diagnostic testing  [facility] Health messaging is intended for the use of basic and simple correspondence pertaining to non urgent matters.   If you have complex concerns or multiple questions, please set up an appointment to review  If you choose to obtain diagnostic testing outside of [facility], it is your responsibility to communicate with the office about forwarding your results.    If your treatment plans includes imaging including a CT ab pelvis or MRI, please confirm any requirements including necessary blood work with radiology at the time of scheduling and notify your provider if updated labs will be needed.

## 2023-05-24 NOTE — Progress Notes
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     Additional co-morbidities: H. Pylori    The patient has been see by Dr. Kern Alberta Rojter. As detailed in note, '' In around 2020/2021, patient underwent abdominal ultrasound and was told had fatty liver. Work up as detailed below:     10/05/2021: ''AB [place]: fatty liver, no liver lesions, spleen 9.7cm''  02/12/2022:  AST 93, ALT 121, ALP 112  08/31/2022: PLT 215, AST 84, ALT 69, ALP 164, Tb 0.8, ceruloplasmin 37, Hepatitis B surface Ag positive, Hepatitis B core Ab positive, HCV Ab negative, A1AT MM  10/04/2022 Fibroscan 22.8kPA, CAP 386  10/24/2022 AB [place]: fatty liver, no hepatic lesions, spleen 9.7cm, no ascites   11/09/2022: AST 51, ALT 46, ALP 172, Tb 0.8, Hepatitis B e Ag negative, HBV PCR negative, HDV Ab positive   Met with Dr. Lorenso Quarry to complete HDV PCR labs but I do not have the results.   OSH hospitalization 4/15-4/20/2024 for left sided abdominal pain and left flank pain associated with watery diarrhea. Underwent CT ab pelvis which described prominent appendix and thickening of cecum. Also underwent an EGD but do not have the results. Notes mention gastritis and gastric polyp  Weight was 152lbs  on 01/04/2023 and now 145lbs.   Met with Dr. Riley Kill 04/25/2023 for abdominal pain   Started Hepatitis B vaccination with PCP and is due for second dose in October 2024.   For arthritis and eyes was started on prednisone and plaquenil.     With today's encounter, the patient is accompanied by her daughter in law.     Meal recall:   Reducing red meat and cheese    Exercise: walking 40 minutes daily     Alcohol: previously drank 3 cups of wine or a few shots on a celebratory occasion in a month. Last use was distantly and stopped upon learning of diagnosis.     Supplements: none    Family History  Brother: liver disease  Uncle: died from liver disease    Social History     Socioeconomic History    Marital status: Married   Tobacco Use    Smoking status: Never    Smokeless tobacco: Never   Substance and Sexual Activity    Alcohol use: Never    Drug use: Never     Social Determinants of Health     Physical Activity: Not at Risk (01/21/2023)    Received from [place], [place]    Physical Activity     Physical Activity: 1   Stress: Not at Risk (01/21/2023)    Received from [place], [place]    Stress     Stress: 1   Financial Resource Strain: Not at Risk (01/21/2023)    Received from [place], [employer]     Financial Resource Strain: 1     Outpatient Medications Prior to Visit   Medication Sig    amLODIPine 5 mg tablet Take 1 tablet (5 mg total) by mouth.    atorvastatin 20 mg tablet Take 1 tablet (20  mg total) by mouth.    hydroxychloroquine 200 mg tablet Take 1 tablet (200 mg total) by mouth daily.    loratadine 10 mg tablet Take 1 tablet (10 mg total) by mouth.    losartan 100 mg tablet Take 1 tablet (100 mg total) by mouth.    LUMIGAN 0.01 % ophthalmic solution Place 1 drop into the right eye.    omeprazole 20 mg DR capsule Take 1 capsule (20 mg total) by mouth daily.    pilocarpine 5 mg tablet Take 1 tablet (5 mg total) by mouth three (3) times daily.    polyethylene glycol powder Take 17 g by mouth daily.    prednisoLONE acetate 1% ophthalmic suspension Place 1 drop into both eyes three (3) times daily.    predniSONE 5 mg tablet Take 1 tablet (5 mg total) by mouth.     No facility-administered medications prior to visit.     Allergies   Allergen Reactions    Lisinopril Other (See Comments)     Headache x 5 months with medication    Hydrochlorothiazide        Review of Systems:   14 point Complete ROS is negative except for above      Objective:      BP 150/81  ~ Pulse 76  ~ Temp 36.7 ?C (98 ?F) (Forehead)  ~ Ht 5' 2'' (1.575 m)  ~ Wt 144 lb 6.4 oz (65.5 kg)  ~ SpO2 97%  ~ BMI 26.41 kg/m?   Gen: NAD, AAO4  HEENT: no scleral icterus  AB: central fat deposition, nontender, nondistended, no hepatomegaly, no splenomegaly, no ascites  Ext: no peripheral edema  MSK: well nourished  SKIN: no jaundice, no spider angioma, no palmer erythema    Lab Review:   02/12/2022  AST 93, ALT 121, ALP 112, Tb 0.8    08/31/2022  PLT 215  AST 84, ALT 69, ALP 164, Tb 0.8  Hepatitis A Ab reactive  Hepatitis B surface Ag reactive   Hepatitis B core AB reactive  HCV AB negative  TS 19%  SMA negative  Ceruloplasmin 37    11/09/2022  PLT 157  Creatinine 0.64, AST 51, ALT 46, ALP 172, Tb 0.8  Hepatitis B e Ag negative, Hepatitis B e AB nonreactive  HBV PCR negative   Hepatitis Delta Ab positive   HIV negative  AFP L3 10.3%    01/04/2023  Hepatitis B core Ab negative  AMA <1:20    01/14/2023  WBC 3.5, Hb 11.2, PLT 216  AST 40, ALT 34, ALP 157, Tb 0.7  INR 0.99    01/15/2023  AST 40, ALT 36, ALP 162, Tb 0.8    01/17/2023  Lipase 74  Hepatitis B surface Ag negative  Hepatitis B e Ag negative  Hepatitis B eAb negative  HBV PCR negative   Hepatitis Delta IgM abnormal   HCV AB negative  AMA negative  TS 13%    01/19/2023  Na 138, creatinine 0.7    02/27/2023  PLT 197  AST 76, ALT 64, ALP 214, Tb 0.7  Hepatitis B core Ab negative  Hepatitis B surface Ab negative  HBV PCR negative    05/24/2023  AST 45, ALT 34, ALP 195, Tb 0.6    Imaging notable for:   AB [place] 10/05/2021  Hyperechoic and heterogenous echotexture of the liver. No intrahepatic lesions  Spleen is 9.7cm  Impression  Fatty infiltration of the liver     Fibroscan 10/04/2022  CAP  286, 22.8kPA    AB [place] 10/24/2022  Hyperechoic and heterogenous echotexture of the liver. No intrahepatic lesions   Spleen is normal at 9.7cm  Impression:   Fatty infiltration of the liver     CT ab pelvis 01/14/2023  Prominent appendix without inflammatory changes. Acute appendicitis not excluded.   Thickening of the cecum through hepatic flexure without associated inflammatory changes. May represent colitis.     AB [place] 01/15/2023  Liver demonstrates heterogenous attenuating echotexture spanning 15.1cm in greatest long axis dimensions. There is no intrahepatic biliary dilation. The portal vein is patent with normal directional flow. The common duct measures 3mm within normal limits. The gallbladder is unremarkable. Limited evaluation of the pancreas, IVC, aorta  No ascites.     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     # MASLD  # Abnormal liver tests  The patient has exhibited elevated liver tests with hepatocellular pattern over time. While there was an initial concern for Hepatitis B based on serologies 08/2022 and query of Hepatitis D, it appears that repeat labs by 12/2022 was not consistent with Hepatitis B, and as such making Hepatitis D unlikely. Unclear if she had a transient Hepatitis B exposure.   OSH Fibroscan 10/2022 notable for 22.8kPA for CAP286 raising concern for cirrhosis. She also concurrent risk factors for MASLD so will have to distinguish abnormal liver tests related to MASLD. Other considerations include AMA negative PBC. She does not have a history of hepatic decompensation. She was recently started on prednisone for rheumatologic condition. Liver tests are improving but unclear if the prednisone is treating an underlying liver disease or if this is coincidental.   Has initiated Hepatitis B vaccination  Recommend lifestyle modifications including avoidance of alcohol, diet and exercise with goal toward weight reduction of at least 10% of body weight to observe improvement in liver tests and any steatosis or fibrosis.   Continue to monitor liver chemistry tests over time  Recommend abdominal imaging every 6 months along with AFP. In light of elevated AFP L3 and single phase CT ab pelvis done while hospitalized which would be insufficient to screen for [facility], recommend CT ab pelvis   Retrieve records of Rheumatology Note  Referral to Clinical Nutrition   Avoid alcohol   Avoid herbal supplement      Follow up: 3 months     35 minutes were spent personally by me today on this encounter which include today's pre-visit review of the chart, obtaining appropriate history, performing an evaluation, documentation and discussion of management with details supported within the note for today's visit. The time documented was exclusive of any time spent on any separately billed procedure.  Thank you for the opportunity to participate in the care of this patient. Please feel free to contact me with any questions or concerns.      Rae Lips, MD  Clinical Instructor, [employer]  Transplant Hepatologist  [facility], [facility], [place]

## 2023-05-27 ENCOUNTER — Telehealth: Payer: PRIVATE HEALTH INSURANCE

## 2023-05-27 DIAGNOSIS — R932 Abnormal findings on diagnostic imaging of liver and biliary tract: Secondary | ICD-10-CM

## 2023-05-27 NOTE — Telephone Encounter
Informed Mrs.Ferran the following (in Spanish)  Per Dr.Iriana  Please inform patient that liver tests are improving

## 2023-05-28 ENCOUNTER — Encounter: Payer: Self-pay | Admitting: *Deleted

## 2023-05-28 ENCOUNTER — Other Ambulatory Visit: Payer: Self-pay

## 2023-05-28 ENCOUNTER — Emergency Department
Admission: EM | Admit: 2023-05-28 | Discharge: 2023-05-28 | Disposition: A | Discharge status: Home / Self Care | Payer: Self-pay | Attending: Emergency Medicine | Admitting: Emergency Medicine

## 2023-05-28 ENCOUNTER — Emergency Department: Payer: Self-pay

## 2023-05-28 DIAGNOSIS — E119 Type 2 diabetes mellitus without complications: Secondary | ICD-10-CM | POA: Insufficient documentation

## 2023-05-28 DIAGNOSIS — M545 Low back pain, unspecified: Secondary | ICD-10-CM | POA: Insufficient documentation

## 2023-05-28 LAB — COMPREHENSIVE METABOLIC PANEL
ALT: 15 U/L (ref 0–44)
AST: 17 U/L (ref 15–41)
Albumin: 3.9 g/dL (ref 3.5–5.0)
Alkaline Phosphatase: 128 U/L — ABNORMAL HIGH (ref 38–126)
Anion gap: 7 (ref 5–15)
BUN: 11 mg/dL (ref 6–20)
CO2: 26 mmol/L (ref 22–32)
Calcium: 9.4 mg/dL (ref 8.9–10.3)
Chloride: 104 mmol/L (ref 98–111)
Creatinine, Ser: 0.52 mg/dL (ref 0.44–1.00)
GFR, Estimated: >60 mL/min (ref >60)
Glucose, Bld: 274 mg/dL — ABNORMAL HIGH (ref 70–99)
Potassium: 4.5 mmol/L (ref 3.5–5.1)
Sodium: 137 mmol/L (ref 135–145)
Total Bilirubin: 0.4 mg/dL (ref 0.3–1.2)
Total Protein: 7 g/dL (ref 6.5–8.1)

## 2023-05-28 LAB — CBC WITH DIFFERENTIAL/PLATELET
Abs Immature Granulocytes: 0.02 10*3/uL (ref 0.00–0.07)
Basophils Absolute: 0 10*3/uL (ref 0.0–0.1)
Basophils Relative: 0 %
Eosinophils Absolute: 0.1 10*3/uL (ref 0.0–0.5)
Eosinophils Relative: 1 %
HCT: 36.9 % (ref 36.0–46.0)
Hemoglobin: 12 g/dL (ref 12.0–15.0)
Immature Granulocytes: 0 %
Lymphocytes Relative: 38 %
Lymphs Abs: 2.4 10*3/uL (ref 0.7–4.0)
MCH: 25.4 pg — ABNORMAL LOW (ref 26.0–34.0)
MCHC: 32.5 g/dL (ref 30.0–36.0)
MCV: 78 fL — ABNORMAL LOW (ref 80.0–100.0)
Monocytes Absolute: 0.5 10*3/uL (ref 0.1–1.0)
Monocytes Relative: 8 %
Neutro Abs: 3.4 10*3/uL (ref 1.7–7.7)
Neutrophils Relative %: 53 %
Platelets: 359 10*3/uL (ref 150–400)
RBC: 4.73 MIL/uL (ref 3.87–5.11)
RDW: 16.6 % — ABNORMAL HIGH (ref 11.5–15.5)
WBC: 6.4 10*3/uL (ref 4.0–10.5)
nRBC: 0 % (ref 0.0–0.2)

## 2023-05-28 LAB — URINALYSIS, ROUTINE W REFLEX MICROSCOPIC
Bilirubin Urine: NEGATIVE
Glucose, UA: >=500 mg/dL — AB
Hgb urine dipstick: NEGATIVE
Ketones, ur: NEGATIVE mg/dL
Nitrite: NEGATIVE
Protein, ur: NEGATIVE mg/dL
Specific Gravity, Urine: 1.02 (ref 1.005–1.030)
pH: 6 (ref 5.0–8.0)

## 2023-05-28 LAB — PREGNANCY, URINE: Preg Test, Ur: NEGATIVE

## 2023-05-28 MED ORDER — MELOXICAM 15 MG PO TABS: 15.0000 mg | ORAL_TABLET | Freq: Every day | ORAL | 0 refills | Status: AC

## 2023-05-28 MED ORDER — LIDOCAINE 5 % EX PTCH
1.0000 | MEDICATED_PATCH | CUTANEOUS | Status: DC
  Administered 2023-05-28: 1 via TRANSDERMAL
  Filled 2023-05-28: qty 1

## 2023-05-28 MED ORDER — HYDROCODONE-ACETAMINOPHEN 5-325 MG PO TABS
2.0000 | ORAL_TABLET | Freq: Once | ORAL | Status: AC
  Administered 2023-05-28: 2 via ORAL
  Filled 2023-05-28: qty 2

## 2023-05-28 MED ORDER — CYCLOBENZAPRINE HCL 7.5 MG PO TABS: 7.5000 mg | ORAL_TABLET | Freq: Three times a day (TID) | ORAL | 0 refills | Status: AC | PRN

## 2023-05-28 NOTE — ED Provider Notes (Signed)
[facility] Provider Note    Event Date/Time   First MD Initiated Contact with Patient 05/28/23 1840     (approximate)   History   Back Pain   HPI  Betty Fernandez is a 52 y.o. female with PMH of diabetes and GERD presents for evaluation of back pain.  Patient states she has had back pain for about 2 weeks.  She endorses numbness, tingling and weakness in her legs.  She has taken ibuprofen and aspirin at home for pain relief but this has not worked.  She denies changes in bladder and bowel function but does report fever and chills.  Patient also describes pain in the bottom of her feet.  She reports having to do a lot of standing and walking at her job which exacerbates her pain.     Physical Exam   Triage Vital Signs: ED Triage Vitals  Encounter Vitals Group     BP 05/28/23 1804 (!) 156/104     Systolic BP Percentile --      Diastolic BP Percentile --      Pulse Rate 05/28/23 1804 97     Resp 05/28/23 1804 20     Temp 05/28/23 1804 99.3 F (37.4 C)     Temp Source 05/28/23 1804 Oral     SpO2 05/28/23 1804 99 %     Weight 05/28/23 1802 168 lb (76.2 kg)     Height 05/28/23 1802 5\' 3"  (1.6 m)     Head Circumference --      Peak Flow --      Pain Score 05/28/23 1802 8     Pain Loc --      Pain Education --      Exclude from Growth Chart --     Most recent vital signs: Vitals:   05/28/23 1804  BP: (!) 156/104  Pulse: 97  Resp: 20  Temp: 99.3 F (37.4 C)  SpO2: 99%    General: Awake, no distress.  CV:  Good peripheral perfusion.  RRR. Resp:  Normal effort.  CTAB. Abd:  No distention.  Other:  Tender to palpation along the lumbar spine and paraspinous muscles. Negative straight leg raise bilaterally.  Patient is able to walk but does so with a widened stance and hunched over.    ED Results / Procedures / Treatments   Labs (all labs ordered are listed, but only abnormal results are displayed) Labs Reviewed  COMPREHENSIVE METABOLIC  PANEL - Abnormal; Notable for the following components:      Result Value   Glucose, Bld 274 (*)    Alkaline Phosphatase 128 (*)    All other components within normal limits  CBC WITH DIFFERENTIAL/PLATELET - Abnormal; Notable for the following components:   MCV 78.0 (*)    MCH 25.4 (*)    RDW 16.6 (*)    All other components within normal limits  URINALYSIS, ROUTINE W REFLEX MICROSCOPIC - Abnormal; Notable for the following components:   Color, Urine STRAW (*)    APPearance CLEAR (*)    Glucose, UA >=500 (*)    Leukocytes,Ua TRACE (*)    Bacteria, UA RARE (*)    All other components within normal limits  PREGNANCY, URINE     PROCEDURES:  Critical Care performed: No  Procedures   MEDICATIONS ORDERED IN ED: Medications - No data to display   IMPRESSION / MDM / ASSESSMENT AND PLAN / ED COURSE  I reviewed the triage vital signs and  the nursing notes.                             52 year old female presents for back pain x 2 weeks.  Patient was hypertensive in triage with a low-grade fever.  Patient in moderate distress on exam, clearly very uncomfortable.  Differential diagnosis includes, but is not limited to, muscle strain, lumbar fracture, lumbar radiculopathy, osteomyelitis.  Patient's presentation is most consistent with acute complicated illness / injury requiring diagnostic workup.  Due to patient's reported history of fevers context of back pain, I wanted to get imaging to rule out possible bony infection.  Lumbar x-rays obtained, interpreted the images as well as reviewed the radiologist report.  Images showed mild multilevel degenerative changes along the lumbar spine and trace retrolisthesis L2 on L3.   UA notable for large amounts of glucose.  Urine pregnancy test was negative. CBC unremarkable.  CMP notable for elevated glucose.   Given that patient's x-rays are negative for signs of osteomyelitis, I feel she is safe for outpatient management.  I discussed  patient's elevated blood sugar and explained that she may be developing peripheral neuropathy which could be causing the pain in the bottom of her feet.  I instructed her to follow-up with her PCP regarding this.  As far as management of her back pain, I will prescribe meloxicam and flexeril.  Patient can also take Tylenol in addition to this.  She was given Norco and a lidocaine patch.  She can use topical pain relievers as well.  I will also provide her with orthopedic follow-up.  Patient voiced understanding, all questions were answered and she was stable at discharge.      FINAL CLINICAL IMPRESSION(S) / ED DIAGNOSES   Final diagnoses:  Acute bilateral low back pain, unspecified whether sciatica present     Rx / DC Orders   ED Discharge Orders          Ordered    cyclobenzaprine (FEXMID) 7.5 MG tablet  3 times daily PRN        05/28/23 2036    meloxicam (MOBIC) 15 MG tablet  Daily        05/28/23 2036             Note:  This document was prepared using Dragon voice recognition software and may include unintentional dictation errors.   Cameron Ali, PA-C 05/28/23 2047    Corena Herter, MD 05/29/23 [phone]

## 2023-05-28 NOTE — Discharge Instructions (Addendum)
Your xrays did not show any fractures, but did show arthritis. Your blood work was normal aside from an elevated blood sugar.   Please take the meloxicam once a day for 2 weeks.  This is an anti-inflammatory.  You can take Tylenol 650 mg every 6 hours as needed for additional pain relief.  You can take the cyclobenzaprine 3 times a day as needed for muscle spasms.  This is a muscle relaxer.  It may make you feel drowsy, so do not drive after taking this medication.  I have attached information for orthopedic follow-up, please call their office and schedule an appointment if you continue to have pain.  Your blood sugar was elevated today.  Please follow-up with your primary care provider for further management of your diabetes.

## 2023-05-28 NOTE — ED Triage Notes (Signed)
Pt to triage via wheelchair.  Pt reports lower back pain for 1 week.  Pt reports pain radiates into both legs.  Pt taking asa and motrin without relief.  Pt alert.

## 2023-06-11 ENCOUNTER — Telehealth: Payer: Self-pay

## 2023-06-11 NOTE — Telephone Encounter (Signed)
Transition Care Management Unsuccessful Follow-up Telephone Call  Date of discharge and from where:  05/28/2023   Attempts:  1st Attempt  Reason for unsuccessful TCM follow-up call:  Missing or invalid number  [facility]  [facility], [facility] Guide Direct Dial: [phone]  Website: [email or web address]

## 2023-06-14 ENCOUNTER — Other Ambulatory Visit: Payer: Self-pay

## 2023-06-14 ENCOUNTER — Emergency Department
Admission: EM | Admit: 2023-06-14 | Discharge: 2023-06-15 | Disposition: A | Discharge status: Home / Self Care | Payer: PRIVATE HEALTH INSURANCE | Attending: Emergency Medicine | Admitting: Emergency Medicine

## 2023-06-14 DIAGNOSIS — L600 Ingrowing nail: Secondary | ICD-10-CM | POA: Insufficient documentation

## 2023-06-14 DIAGNOSIS — E119 Type 2 diabetes mellitus without complications: Secondary | ICD-10-CM | POA: Diagnosis not present

## 2023-06-14 MED ORDER — LIDOCAINE HCL (PF) 1 % IJ SOLN
5.0000 mL | Freq: Once | INTRAMUSCULAR | Status: AC
  Administered 2023-06-15: 5 mL
  Filled 2023-06-14: qty 5

## 2023-06-14 NOTE — ED Triage Notes (Signed)
Pt to ED c/o ingrown toenail on right great toe.

## 2023-06-15 MED ORDER — HYDROCODONE-ACETAMINOPHEN 5-325 MG PO TABS
1.0000 | ORAL_TABLET | Freq: Once | ORAL | Status: AC
  Administered 2023-06-15: 1 via ORAL
  Filled 2023-06-15: qty 1

## 2023-06-15 MED ORDER — HYDROCODONE-ACETAMINOPHEN 5-325 MG PO TABS: 1.0000 | ORAL_TABLET | Freq: Three times a day (TID) | ORAL | 0 refills | Status: AC | PRN

## 2023-06-15 MED ORDER — CEPHALEXIN 500 MG PO CAPS
500.0000 mg | ORAL_CAPSULE | Freq: Once | ORAL | Status: AC
  Administered 2023-06-15: 500 mg via ORAL
  Filled 2023-06-15: qty 1

## 2023-06-15 MED ORDER — CEPHALEXIN 500 MG PO CAPS: 500.0000 mg | ORAL_CAPSULE | Freq: Three times a day (TID) | ORAL | 0 refills | Status: AC

## 2023-06-15 NOTE — ED Notes (Signed)
Pt and family member verbalize understanding of discharge instructions

## 2023-06-15 NOTE — Discharge Instructions (Signed)
Keep the wound clean, dry, and covered with Vaseline soaked gauze or antibiotic ointment and a dressing.  Soak the toe daily in warm water and Epsom salts baths to help promote healing.  Take the antibiotic as directed and the pain medicine as needed.  Follow-up with [facility] podiatry for wound check as discussed.  You should establish care with [facility] podiatry for ongoing diabetic foot care.  Mantenga la herida limpia, seca y [place] con una gasa empapada en vaselina o un ungento antibitico y un apsito.  Remoje el dedo del pie diariamente en agua tibia y baos de sales de [employer] para ayudar a Research scientist (physical sciences) curacin.  Tome el antibitico segn las indicaciones y el analgsico segn sea necesario.  Haga un seguimiento con el podlogo de [facility] para revisar la herida, como se Actor.  Debe establecer atencin con el podlogo de [facility] para el cuidado continuo del pie diabtico.

## 2023-06-16 NOTE — ED Provider Notes (Signed)
[facility] Emergency Department Provider Note     Event Date/Time   First MD Initiated Contact with Patient 06/14/23 2237     (approximate)   History   Toe Pain   HPI  Betty Fernandez is a 52 y.o. female presents to the ED with complaints of an ingrown toenail to the right great toe.  Patient with history of diabetes as well as GERD, attempted to trim the toenail herself without success.     Physical Exam   Triage Vital Signs: ED Triage Vitals  Encounter Vitals Group     BP 06/14/23 2058 (!) 189/88     Systolic BP Percentile --      Diastolic BP Percentile --      Pulse Rate 06/14/23 2058 87     Resp 06/14/23 2058 18     Temp 06/14/23 2058 98.8 F (37.1 C)     Temp Source 06/14/23 2058 Oral     SpO2 06/14/23 2058 100 %     Weight 06/14/23 2103 168 lb (76.2 kg)     Height 06/14/23 2103 5\' 3"  (1.6 m)     Head Circumference --      Peak Flow --      Pain Score 06/14/23 2102 5     Pain Loc --      Pain Education --      Exclude from Growth Chart --     Most recent vital signs: Vitals:   06/14/23 2058 06/15/23 0019  BP: (!) 189/88 (!) 163/82  Pulse: 87 98  Resp: 18 16  Temp: 98.8 F (37.1 C) 97.9 F (36.6 C)  SpO2: 100% 98%    General Awake, no distress. NAD CV:  Good peripheral perfusion.  RESP:  Normal effort.  ABD:  No distention.  MSK:  Right great toe with evidence of hypertrophic lateral cuticle and some mild purulent drainage noted.   ED Results / Procedures / Treatments   Labs (all labs ordered are listed, but only abnormal results are displayed) Labs Reviewed - No data to display   EKG   RADIOLOGY   No results found.   PROCEDURES:  Critical Care performed: No  .Nail Removal  Date/Time: 06/15/2023 11:55 PM  Performed by: Lissa Hoard, PA-C Authorized by: Lissa Hoard, PA-C   Consent:    Consent obtained:  Verbal   Consent given by:  Patient   Risks, benefits, and  alternatives were discussed: yes     Risks discussed:  Bleeding, pain and permanent nail deformity Universal protocol:    Site/side marked: yes     Patient identity confirmed:  Verbally with patient Pre-procedure details:    Skin preparation:  Povidone-iodine   Preparation: Patient was prepped and draped in the usual sterile fashion   Procedure details:    Location:  Foot   Foot location:  R big toe Anesthesia:    Anesthesia method:  Nerve block   Block location:  Flexor tendon sheath   Block needle gauge:  27 G   Block anesthetic:  Lidocaine 1% w/o epi   Block technique:  Transthecal block   Block injection procedure:  Anatomic landmarks palpated, introduced needle, negative aspiration for blood and incremental injection   Block outcome:  Anesthesia achieved Nail Removal:    Nail removed:  Partial   Nail side:  Lateral   Nail bed repaired: no     Removed nail replaced and anchored: no   Ingrown  nail:    Wedge excision of skin: no     Nail matrix removed or ablated:  None Post-procedure details:    Dressing:  Petrolatum-impregnated gauze   Procedure completion:  Tolerated well, no immediate complications    MEDICATIONS ORDERED IN ED: Medications  lidocaine (PF) (XYLOCAINE) 1 % injection 5 mL (5 mLs Infiltration Given 06/15/23 0023)  HYDROcodone-acetaminophen (NORCO/VICODIN) 5-325 MG per tablet 1 tablet (1 tablet Oral Given 06/15/23 0022)  cephALEXin (KEFLEX) capsule 500 mg (500 mg Oral Given 06/15/23 0022)     IMPRESSION / MDM / ASSESSMENT AND PLAN / ED COURSE  I reviewed the triage vital signs and the nursing notes.                              Differential diagnosis includes, but is not limited to, nail avulsion, toe contusion, toe fracture, cellulitis, ingrown nail  Patient's presentation is most consistent with acute, uncomplicated illness.  Patient's diagnosis is consistent with ingrown nail. Patient will be discharged home with prescriptions for hydrocodone and  Keflex. Patient is to follow up with podiatry as discussed as needed or otherwise directed. Patient is given ED precautions to return to the ED for any worsening or new symptoms.     FINAL CLINICAL IMPRESSION(S) / ED DIAGNOSES   Final diagnoses:  Ingrown nail of great toe     Rx / DC Orders   ED Discharge Orders          Ordered    cephALEXin (KEFLEX) 500 MG capsule  3 times daily        06/15/23 0018    HYDROcodone-acetaminophen (NORCO) 5-325 MG tablet  3 times daily PRN        06/15/23 0018             Note:  This document was prepared using Dragon voice recognition software and may include unintentional dictation errors.    Lissa Hoard, PA-C 06/16/23 6962    Phineas Semen, MD 06/16/23 [phone]

## 2023-06-19 DIAGNOSIS — B181 Chronic viral hepatitis B without delta-agent: Secondary | ICD-10-CM

## 2023-06-20 ENCOUNTER — Inpatient Hospital Stay: Payer: PRIVATE HEALTH INSURANCE | Attending: Student in an Organized Health Care Education/Training Program

## 2023-06-20 ENCOUNTER — Telehealth: Payer: PRIVATE HEALTH INSURANCE

## 2023-06-20 MED ADMIN — IOHEXOL 350 MG/ML IV SOLN: 100 mL | INTRAVENOUS | @ 02:00:00 | Stop: 2023-06-20 | NDC 00407141491

## 2023-06-20 NOTE — Telephone Encounter
Called and informed patient of  per Dr. Bethann Humble there were no liver lesions or malignancies.     Patient verbalized understanding.

## 2023-06-20 NOTE — Telephone Encounter
-----   Message from Rae Lips, MD, MS sent at 06/20/2023 12:20 PM PDT -----  Hello,     Please inform patient that there were no liver lesions or malignancies.     Rae Lips, MD

## 2023-06-20 NOTE — Progress Notes
Hello,     Please inform patient that there were no liver lesions or malignancies.     Rae Lips, MD

## 2023-07-10 ENCOUNTER — Other Ambulatory Visit: Payer: Self-pay

## 2023-07-10 ENCOUNTER — Emergency Department
Admission: EM | Admit: 2023-07-10 | Discharge: 2023-07-10 | Disposition: A | Discharge status: Home / Self Care | Payer: PRIVATE HEALTH INSURANCE | Attending: Emergency Medicine | Admitting: Emergency Medicine

## 2023-07-10 DIAGNOSIS — M25562 Pain in left knee: Secondary | ICD-10-CM | POA: Diagnosis not present

## 2023-07-10 DIAGNOSIS — M25561 Pain in right knee: Secondary | ICD-10-CM | POA: Insufficient documentation

## 2023-07-10 DIAGNOSIS — M545 Low back pain, unspecified: Secondary | ICD-10-CM | POA: Insufficient documentation

## 2023-07-10 DIAGNOSIS — E119 Type 2 diabetes mellitus without complications: Secondary | ICD-10-CM | POA: Diagnosis not present

## 2023-07-10 LAB — URINALYSIS, ROUTINE W REFLEX MICROSCOPIC
Bilirubin Urine: NEGATIVE
Glucose, UA: >=500 mg/dL — AB
Hgb urine dipstick: NEGATIVE
Ketones, ur: NEGATIVE mg/dL
Nitrite: NEGATIVE
Protein, ur: NEGATIVE mg/dL
Specific Gravity, Urine: 1.025 (ref 1.005–1.030)
pH: 5 (ref 5.0–8.0)

## 2023-07-10 MED ORDER — OXYCODONE-ACETAMINOPHEN 5-325 MG PO TABS: 1.0000 | ORAL_TABLET | ORAL | 0 refills | Status: AC | PRN

## 2023-07-10 MED ORDER — OXYCODONE-ACETAMINOPHEN 5-325 MG PO TABS
1.0000 | ORAL_TABLET | Freq: Once | ORAL | Status: AC
  Administered 2023-07-10: 1 via ORAL
  Filled 2023-07-10: qty 1

## 2023-07-10 NOTE — ED Provider Notes (Signed)
[facility] Provider Note    Event Date/Time   First MD Initiated Contact with Patient 07/10/23 1702     (approximate)   History   Leg Pain   HPI  Betty Fernandez is a 52 y.o. female with PMH of diabetes and GERD presents for evaluation of bilateral leg, knee pain and lower back pain after a fall yesterday.  Patient states she was coming inside her house, bent down to pick something up and her knees gave out causing her to fall and land on both of her knees.  She did not hit her head, no LOC.  She is taken some Advil at home to manage her pain.      Physical Exam   Triage Vital Signs: ED Triage Vitals  Encounter Vitals Group     BP 07/10/23 1517 (!) 154/99     Systolic BP Percentile --      Diastolic BP Percentile --      Pulse Rate 07/10/23 1517 91     Resp 07/10/23 1517 17     Temp 07/10/23 1517 98 F (36.7 C)     Temp src --      SpO2 07/10/23 1517 99 %     Weight --      Height --      Head Circumference --      Peak Flow --      Pain Score 07/10/23 1518 10     Pain Loc --      Pain Education --      Exclude from Growth Chart --     Most recent vital signs: Vitals:   07/10/23 1517  BP: (!) 154/99  Pulse: 91  Resp: 17  Temp: 98 F (36.7 C)  SpO2: 99%   General: Awake, no distress.  CV:  Good peripheral perfusion.  RRR. Resp:  Normal effort.  CTAB. Abd:  No distention.  Soft, tender to palpation suprapubically, all sounds appropriate. Other:  Tender to palpation surrounding the knee joint bilaterally, unable to perform ROM due to pain.  No overlying skin changes or notable swelling.  Mild tenderness to palpation along the lumbar spine paraspinal muscles.   ED Results / Procedures / Treatments   Labs (all labs ordered are listed, but only abnormal results are displayed) Labs Reviewed  URINALYSIS, ROUTINE W REFLEX MICROSCOPIC - Abnormal; Notable for the following components:      Result Value   Color, Urine YELLOW (*)     APPearance HAZY (*)    Glucose, UA >=500 (*)    Leukocytes,Ua SMALL (*)    Bacteria, UA RARE (*)    All other components within normal limits     PROCEDURES:  Critical Care performed: No  Procedures   MEDICATIONS ORDERED IN ED: Medications  oxyCODONE-acetaminophen (PERCOCET/ROXICET) 5-325 MG per tablet 1 tablet (1 tablet Oral Given 07/10/23 1834)     IMPRESSION / MDM / ASSESSMENT AND PLAN / ED COURSE  I reviewed the triage vital signs and the nursing notes.                             52 year old female presents for evaluation of bilateral knee pain after a fall.  Patient was hypertensive in triage otherwise vital signs stable.  Patient is in pain on exam.  Differential diagnosis includes, but is not limited to, contusion, muscle strain, fracture, dislocation.  Patient's presentation is most consistent with acute,  uncomplicated illness.  Urinalysis obtained as patient reported suprapubic pain and urinary frequency.  UA was notable for presence of glucose, but was otherwise unremarkable.   Since patient is still able to walk although with some difficulty, I do not feel that x-rays were indicated at this time.  I advised patient on symptomatic management using pain medication.  I will send her a couple days of Percocet.  She also got a note for work.  She will need to follow-up with orthopedics if she continues to have symptoms.  She was agreeable to plan, voiced understanding, all questions were answered and she was stable at discharge.  A video Spanish interpreter was used throughout the duration of this visit.     FINAL CLINICAL IMPRESSION(S) / ED DIAGNOSES   Final diagnoses:  Acute pain of both knees     Rx / DC Orders   ED Discharge Orders          Ordered    oxyCODONE-acetaminophen (PERCOCET) 5-325 MG tablet  Every 4 hours PRN        07/10/23 1827             Note:  This document was prepared using Dragon voice recognition software and may include  unintentional dictation errors.   Cameron Ali, PA-C 07/10/23 1840    Phineas Semen, MD 07/10/23 [phone]

## 2023-07-10 NOTE — ED Notes (Signed)
Pt verbalizes understanding of discharge instructions. Opportunity for questioning and answers were provided. Pt discharged from ED to home with husband.    

## 2023-07-10 NOTE — Discharge Instructions (Addendum)
Take the percocet every 6 hours for severe pain. Otherwise You can take 650 mg of Tylenol and 600 mg of ibuprofen every 6 hours as needed for pain.  You can use heat, ice and topical pain relievers as well.   Follow up with orthopedics.

## 2023-07-10 NOTE — ED Triage Notes (Addendum)
Pt presents to ED with c/o of bilateral leg pain and back pain due to fall yesterday. Pt states bilateral knee pain, no bruising or swelling noted.  Pt denies injury. NAD Noted.    [employer] [phone]

## 2023-07-17 ENCOUNTER — Telehealth: Payer: Self-pay

## 2023-07-17 NOTE — Telephone Encounter (Signed)
Transition Care Management Unsuccessful Follow-up Telephone Call  Date of discharge and from where:  [facility] 9/14  Attempts:  1st Attempt  Reason for unsuccessful TCM follow-up call:  No answer/busy   Betty Fernandez [place]  [facility], [facility] Guide, Phone: [phone] Website: [email or web address]

## 2023-07-18 ENCOUNTER — Telehealth: Payer: Self-pay

## 2023-07-18 NOTE — Telephone Encounter (Signed)
Transition Care Management Unsuccessful Follow-up Telephone Call  Date of discharge and from where:  [place] 9/14  Attempts:  2nd Attempt  Reason for unsuccessful TCM follow-up call:  No answer/busy   Lenard Forth [place]  [facility], [facility] Guide, Phone: [phone] Website: [email or web address]

## 2023-08-13 ENCOUNTER — Ambulatory Visit: Payer: PRIVATE HEALTH INSURANCE | Attending: Student in an Organized Health Care Education/Training Program

## 2023-08-13 DIAGNOSIS — R7989 Other specified abnormal findings of blood chemistry: Secondary | ICD-10-CM

## 2023-08-13 DIAGNOSIS — K760 Fatty (change of) liver, not elsewhere classified: Principal | ICD-10-CM

## 2023-08-13 MED ORDER — OMEPRAZOLE 20 MG PO CPDR
20 mg | ORAL_CAPSULE | Freq: Every day | ORAL | 3 refills | Status: AC
Start: 2023-08-13 — End: ?

## 2023-08-13 NOTE — Patient Instructions
It was a pleasure meeting with you today.   Send me a copy of your labs and rheumatology report this month  Labs in 3 months   MRI 11/2023  Avoid alcohol  Avoid herbal supplements  Tylenol can be used for pain or fevers but should not exceed 2000mg  per day  Please note that we will make our best effort to contact you by phone regarding urgent concerns and critical diagnostic testing  [facility] messaging is intended for the use of basic and simple correspondence pertaining to non urgent matters.   If you have complex concerns or multiple questions, please set up an appointment to review  If you choose to obtain diagnostic testing outside of [facility], it is your responsibility to communicate with the office about forwarding your results.    If your treatment plans includes imaging including a CT ab pelvis or MRI, please confirm any requirements including necessary blood work with radiology at the time of scheduling and notify your provider if updated labs will be needed.

## 2023-08-13 NOTE — Progress Notes
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     Additional co-morbidities: H. Pylori    The patient has been see by Dr. Kern Alberta Rojter. As detailed in note, '' In around 2020/2021, patient underwent abdominal ultrasound and was told had fatty liver. Work up as detailed below:     10/05/2021: ''AB [place]: fatty liver, no liver lesions, spleen 9.7cm''  02/12/2022:  AST 93, ALT 121, ALP 112  08/31/2022: PLT 215, AST 84, ALT 69, ALP 164, Tb 0.8, ceruloplasmin 37, Hepatitis B surface Ag positive, Hepatitis B core Ab positive, HCV Ab negative, A1AT MM  10/04/2022 Fibroscan 22.8kPA, CAP 386  10/24/2022 AB [place]: fatty liver, no hepatic lesions, spleen 9.7cm, no ascites   11/09/2022: AST 51, ALT 46, ALP 172, Tb 0.8, Hepatitis B e Ag negative, HBV PCR negative, HDV Ab positive   Met with Dr. Lorenso Quarry to complete HDV PCR labs but I do not have the results.   OSH hospitalization 4/15-4/20/2024 for left sided abdominal pain and left flank pain associated with watery diarrhea. Underwent CT ab pelvis which described prominent appendix and thickening of cecum. Also underwent an EGD but do not have the results. Notes mention gastritis and gastric polyp  Weight was 152lbs  on 01/04/2023 and now 145lbs.   Met with Dr. Riley Kill 04/25/2023 for abdominal pain   Started Hepatitis B vaccination with PCP and is due for second dose in October 2024.   For arthritis and eyes was started on prednisone and plaquenil for Sjogren. Has upcoming appointment with rheumatology     With today's encounter, the patient is accompanied by her son. thinks tastes blood in mouth but no hematemesis, melena or hematochezia. Feels some chest discomfort. Some blood noted when blows nose. Weight is stable. Sees Gastroenterology Dr. Riley Kill. Still on plaquenil and prednisone 5mg  per rheumatology     Alcohol: previously drank 3 cups of wine or a few shots on a celebratory occasion in a month. Last use was distantly and stopped upon learning of diagnosis.     Supplements: none    Family History  Brother: liver disease  Uncle: died from liver disease    Social History     Socioeconomic History    Marital status: Married   Tobacco Use    Smoking status: Never    Smokeless tobacco: Never   Substance and Sexual Activity    Alcohol use: Never    Drug use: Never     Social Determinants of Health     Physical Activity: Not at Risk (01/21/2023)    Received from [place], [place]    Physical Activity     Physical Activity: 1   Stress: Not at Risk (01/21/2023)    Received from [facility]    Stress     Stress: 1   Financial Resource Strain: Not at Risk (01/21/2023)    Received from [place], [employer]     Financial Resource Strain: 1     Outpatient Medications Prior to Visit   Medication Sig    amLODIPine 5 mg tablet  Take 1 tablet (5 mg total) by mouth.    atorvastatin 20 mg tablet Take 1 tablet (20 mg total) by mouth.    hydroxychloroquine 200 mg tablet Take 1 tablet (200 mg total) by mouth daily.    loratadine 10 mg tablet Take 1 tablet (10 mg total) by mouth.    losartan 100 mg tablet Take 1 tablet (100 mg total) by mouth.    LUMIGAN 0.01 % ophthalmic solution Place 1 drop into the right eye.    omeprazole 20 mg DR capsule Take 1 capsule (20 mg total) by mouth daily.    pilocarpine 5 mg tablet Take 1 tablet (5 mg total) by mouth three (3) times daily.    polyethylene glycol powder Take 17 g by mouth daily.    prednisoLONE acetate 1% ophthalmic suspension Place 1 drop into both eyes three (3) times daily.    predniSONE 5 mg tablet Take 1 tablet (5 mg total) by mouth.     No facility-administered medications prior to visit.     Allergies   Allergen Reactions    Lisinopril Other (See Comments)     Headache x 5 months with medication    Hydrochlorothiazide        Review of Systems:   14 point Complete ROS is negative except for above      Objective:      BP 146/81  ~ Pulse 88  ~ Temp 36.4 ?C (97.6 ?F) (Forehead)  ~ Ht 5' 2'' (1.575 m)  ~ Wt 146 lb (66.2 kg)  ~ SpO2 97%  ~ BMI 26.70 kg/m?   Gen: NAD, AAO4  HEENT: no scleral icterus  AB: central fat deposition, nontender, nondistended, no hepatomegaly, no splenomegaly, no ascites  Ext: no peripheral edema  MSK: well nourished  SKIN: no jaundice, no spider angioma, no palmer erythema    Lab Review:   02/12/2022  AST 93, ALT 121, ALP 112, Tb 0.8    08/31/2022  PLT 215  AST 84, ALT 69, ALP 164, Tb 0.8  Hepatitis A Ab reactive  Hepatitis B surface Ag reactive   Hepatitis B core AB reactive  HCV AB negative  TS 19%  SMA negative  Ceruloplasmin 37    11/09/2022  PLT 157  Creatinine 0.64, AST 51, ALT 46, ALP 172, Tb 0.8  Hepatitis B e Ag negative, Hepatitis B e AB nonreactive  HBV PCR negative   Hepatitis Delta Ab positive   HIV negative  AFP L3 10.3%    01/04/2023  Hepatitis B core Ab negative  AMA <1:20    01/14/2023  WBC 3.5, Hb 11.2, PLT 216  AST 40, ALT 34, ALP 157, Tb 0.7  INR 0.99    01/15/2023  AST 40, ALT 36, ALP 162, Tb 0.8    01/17/2023  Lipase 74  Hepatitis B surface Ag negative  Hepatitis B e Ag negative  Hepatitis B eAb negative  HBV PCR negative   Hepatitis Delta IgM abnormal   HCV AB negative  AMA negative  TS 13%    01/19/2023  Na 138, creatinine 0.7    02/27/2023  PLT 197  AST 76, ALT 64, ALP 214, Tb 0.7  Hepatitis B core Ab negative  Hepatitis B surface Ab negative  HBV PCR negative    05/24/2023  AST 45, ALT 34, ALP 195, Tb 0.6    07/25/2023  PLT 190  AST 26, ALT 23, ALP 150, Tb 0.8  INR 1.1  A1c 5.7  Imaging notable for:   AB [place] 10/05/2021  Hyperechoic and heterogenous echotexture of the liver. No intrahepatic lesions  Spleen is 9.7cm  Impression  Fatty infiltration of the liver Fibroscan 10/04/2022  CAP 286, 22.8kPA    AB [place] 10/24/2022  Hyperechoic and heterogenous echotexture of the liver. No intrahepatic lesions   Spleen is normal at 9.7cm  Impression:   Fatty infiltration of the liver     CT ab pelvis 01/14/2023  Prominent appendix without inflammatory changes. Acute appendicitis not excluded.   Thickening of the cecum through hepatic flexure without associated inflammatory changes. May represent colitis.     AB [place] 01/15/2023  Liver demonstrates heterogenous attenuating echotexture spanning 15.1cm in greatest long axis dimensions. There is no intrahepatic biliary dilation. The portal vein is patent with normal directional flow. The common duct measures 3mm within normal limits. The gallbladder is unremarkable. Limited evaluation of the pancreas, IVC, aorta  No ascites.     CT ab pelvis 06/20/2023  1.  No definite CT findings to explain abdominal pain/weight loss.  2.  Mildly irregular hepatic surface contour, likely reflects chronic liver disease. No focal liver lesion. Small perisplenic collaterals, but otherwise no signs of portal hypertension.  3.  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     # MASLD  # Abnormal liver tests  # Abnormal imaging of the liver   The patient has exhibited elevated liver tests with hepatocellular pattern over time. While there was an initial concern for Hepatitis B based on serologies 08/2022 and query of Hepatitis D, it appears that repeat labs by 12/2022 was not consistent with Hepatitis B, and as such making Hepatitis D unlikely. Unclear if she had a transient Hepatitis B exposure.   OSH Fibroscan 10/2022 notable for 22.8kPA for CAP286 raising concern for cirrhosis. She also concurrent risk factors for MASLD so will have to distinguish abnormal liver tests related to MASLD. Other considerations include AMA negative PBC. She does not have a history of hepatic decompensation. She was recently started on prednisone for rheumatologic condition. Liver tests are improving but unclear if the prednisone is treating an underlying liver disease or if this is coincidental since has made some lifestyle changes. Additionally, it could be that prednisone may have been helping with joints and that could have helped the elevated ALP.   Recommend lifestyle modifications including avoidance of alcohol, diet and exercise with goal toward weight reduction of at least 10% of body weight to observe improvement in liver tests and any steatosis or fibrosis.   Continue to monitor liver chemistry tests over time  Recommend patient up date me once has follow up with rheumatology. If liver tests worsen off prednisone, then we may consider liver biopsy. If liver tests remain stable off prednisone, then MASLD seems much more likely   MRI by 11/2023 for [facility] screening until repeat fibrosis assessment  Referral to Clinical Nutrition   Avoid alcohol   Avoid herbal supplement    # Metallic taste in mouth  Likely has GERD. No overt signs of bleeding  Maintain follow up with Dr. Riley Kill  Trial  PPI    # Pancreas cyst  Small and noted on CT ab pelvis  Monitor with MRI annually     Follow up: 4 months     35 minutes were spent personally by me today on this encounter which include today's pre-visit review of the chart, obtaining appropriate history, performing an evaluation, documentation and discussion of management with details supported within the note for today's visit. The time documented was exclusive of any time spent on any separately billed procedure.  Thank you for the opportunity to participate in the care of this patient. Please feel free to contact me with any questions or concerns.      Rae Lips, MD  Clinical Instructor, [employer]  Transplant Hepatologist  [facility], [facility], [place]

## 2023-10-08 ENCOUNTER — Telehealth: Payer: PRIVATE HEALTH INSURANCE

## 2023-10-08 NOTE — Telephone Encounter
[employer] Verification [employer] Verification/Authorization     Regarding: Insurance Verification []     Authorization [x]    Missing Documentation []                           Date of Service:   10/15/2023      Provider:         Matthew Saras., MD GASTRO SM     Reason: Loraine Leriche appropriate reasoning       []  Patient Not Financially Cleared     []  Not a covered benefit  []  No active Insurance Coverage     [x]  Authorization    []  Patient Authorization approved and is cleared to schedule appt.          Other (explain):    AUTH IS REQUIRED / PT IS NOT CLEARED

## 2023-10-15 ENCOUNTER — Ambulatory Visit: Payer: PRIVATE HEALTH INSURANCE | Attending: Gastroenterology

## 2023-10-15 DIAGNOSIS — Z8619 Personal history of other infectious and parasitic diseases: Secondary | ICD-10-CM

## 2023-10-15 DIAGNOSIS — R109 Unspecified abdominal pain: Secondary | ICD-10-CM

## 2023-10-15 DIAGNOSIS — R1310 Dysphagia, unspecified: Secondary | ICD-10-CM

## 2023-10-15 MED ORDER — OMEPRAZOLE 40 MG PO CPDR
40 mg | ORAL_CAPSULE | Freq: Every day | ORAL | 0 refills | Status: AC
Start: 2023-10-15 — End: ?

## 2023-10-15 NOTE — Progress Notes
OUTPATIENT GASTROENTEROLOGY FOLLOW UP    Date of service: 10/15/2023  Reason for consult: LUQ pain   Referring Provider: Matthew Saras., MD  PCP: Rojter, Mirian Capuchin, MD    HISTORY OF PRESENT ILLNESS:   53 y.o. female who presents for LUQ pain. History of scleroderma, overlap RA/sjogrens/limited sclerosis    Chronic LUQ which became more severe at the beginning of this year.   Presented to outside hospital in April with pain and diarrhea. CT showed cecal wall thickening. Treated with ceftriaxone and flagyl. Reportedly also treated for pancreatitis although CT did not show pancreatitis.   Outside EGD showed H pylori gastritis. She reports that it was treated with antibiotics.   She reports constipation. Tries to manage with fruits. BM every day. However +straining and discomfort.   Colonoscopy in June was unremarkable.     Interval 10/15/23:  Accompanied by daughter Darien Ramus   HP breath test negative 05/2023  Continues to have LUQ pain; it is about 5/10 in severity   Better with food   She is taking omeprazole 20mg  daily   Reports dysphagia - as if food is getting stuck in throat - has to clear throat   She has sicca and is using pilocarpine 5mg  TID   +Dysphonia     PAST MEDICAL HISTORY:   No past medical history on file.    PAST SURGICAL HISTORY:   No past surgical history on file.    ALLERGIES:    Lisinopril and Hydrochlorothiazide    HOME MEDICATIONS:     Outpatient Medications Prior to Visit   Medication Sig    amLODIPine 5 mg tablet Take 1 tablet (5 mg total) by mouth.    atorvastatin 20 mg tablet Take 1 tablet (20 mg total) by mouth.    hydroxychloroquine 200 mg tablet Take 1 tablet (200 mg total) by mouth daily.    losartan 100 mg tablet Take 1 tablet (100 mg total) by mouth.    LUMIGAN 0.01 % ophthalmic solution Place 1 drop into the right eye.    omeprazole 20 mg DR capsule Take 1 capsule (20 mg total) by mouth daily.    pilocarpine 5 mg tablet Take 1 tablet (5 mg total) by mouth three (3) times daily. prednisoLONE acetate 1% ophthalmic suspension Place 1 drop into both eyes three (3) times daily.    predniSONE 5 mg tablet Take 1 tablet (5 mg total) by mouth.    loratadine 10 mg tablet Take 1 tablet (10 mg total) by mouth. (Patient not taking: Reported on 10/15/2023)    polyethylene glycol powder Take 17 g by mouth daily. (Patient not taking: Reported on 10/15/2023)     No facility-administered medications prior to visit.        FAMILY HISTORY:   No family history on file.    SOCIAL HISTORY:     Social History     Tobacco Use    Smoking status: Never    Smokeless tobacco: Never   Substance Use Topics    Alcohol use: Never    Drug use: Never       REVIEW OF SYSTEMS:   A 14- point review of systems was negative except where noted in the HPI.    PHYSICAL EXAM:   Ht 5' (1.524 m)  ~ Wt 147 lb 6.4 oz (66.9 kg)  ~ BMI 28.79 kg/m?     Gen: No acute distress, answers questions appropriately.  Eyes: Anicteric sclera, no conjunctival pallor  Neck: Trachea midline, good  range of motion  Resp: normal WOB   MSK: No peripheral edema, clubbing, or cyanosis   Neuro: Alert and oriented, grossly nonfocal, moving all extremities   Skin: Warm and well perfused, no rashes  Psych: Normal mood and affect    LABORATORY DATA:     Lab Results   Component Value Date    WBC 5.03 05/24/2023    HGB 11.7 05/24/2023    HCT 36.5 05/24/2023    PLT 239 05/24/2023     Lab Results   Component Value Date    AST 45 05/24/2023    AST 76 (H) 02/27/2023    AST 92 (H) 01/04/2023    ALT 34 05/24/2023    ALT 64 02/27/2023    ALT 79 (H) 01/04/2023    BILITOT 0.6 05/24/2023    BILITOT 0.7 02/27/2023    BILITOT 0.6 01/04/2023    ALKPHOS 195 (H) 05/24/2023    ALKPHOS 214 (H) 02/27/2023    ALKPHOS 253 (H) 01/04/2023    INR 1.1 05/24/2023    ALBUMIN 4.4 05/24/2023    AFP 3 05/24/2023    AFP 4 01/04/2023      Lab Results   Component Value Date    CREAT 0.64 05/24/2023    BUN 13 05/24/2023    NA 140 05/24/2023    K 3.6 05/24/2023    CL 107 (H) 05/24/2023    CO2 22 05/24/2023    CALCIUM 9.5 05/24/2023     No results found for: ''HBVQPCR''  No results found for: ''HCVQPCR''    RADIOLOGY:     CT ab pelvis 01/14/2023:          ENDOSCOPY:     Colonoscopy 03/08/23:    FINDINGS:   Normal TI. Diminutive transverse colon polyp removed with cold forceps polypectomy. Sigmoid diverticulosis. The colonoscopy examination was otherwise normal.        COLON, TRANSVERSE, POLYP (BIOPSY):   - Colonic mucosa with prominent benign appearing lymphoid aggregate  - No dysplasia (multiple deeper levels examined)            ASSESSMENT/RECOMMENDATIONS:     Problem List Items Addressed This Visit    None      LUQ/flank pain. History of H pylori, eradication confirmed on UBT. Will try increasing omeprazole to 40mg .  Gastric IM. Repeat endoscopy this year for mapping.   Dysphagia and dysphonia. Suspect secondary to sicca (responding to pilocarpine and appears to be at max dose) but will evaluate further with esophagram in light of previously normal esophagus on endoscopy. Can consider motility testing since she has limited sclerosis, although unlikely that this will change management.        Orders Placed This Encounter    FL esophagus barium swallow    omeprazole 40 mg DR capsule       Levora Dredge, MD   [facility]     Problem:   []  New minor/self-limited problem  []  Acute uncomplicated illness/injury   []   Acute systemic illness or complicated injury   []   New problem with uncertain diagnosis  []  New problem that poses threat to life or bodily function  []   2 chronic stable problems   [x]   1 chronic unstable problem     Review of Data:   I have   []  Reviewed/ordered []  1 []  2 []  >= 3 unique laboratory, radiology, and/or diagnostic tests noted above   []  Reviewed []  1 []  2 []  >= 3 prior  external notes and incorporated into patient assessment   []  Discussed management or test interpretation with external provider(s) as noted     Risk of Complication:   This Clinical research associate has deemed the above diagnoses to have a risk of complication, morbidity or mortality of:   []  Minimal;    []  Low;     [x]  Moderate;     []  Severe

## 2023-10-15 NOTE — Patient Instructions
Increase omeprazole from 20mg  to 40mg      Esophagram -   Call [facility] Radiology:  Please call us at [phone] to schedule your appointment, we are open M-F 7am to 7pm. Please visit [place] at [email or web address] for locations, hours, services and more.

## 2023-11-25 IMAGING — MR MR CERVICAL SPINE W/O CM
4 of 5 series · 27 of 48 positions shown · non-contrast
Comparison: None Available.

CLINICAL DATA: Neck pain radiating down the right arm. Weakness.
Right-sided numbness.

EXAM:
MRI CERVICAL SPINE WITHOUT CONTRAST
TECHNIQUE: Multiplanar, multisequence MR imaging of the cervical spine was
performed. No intravenous contrast was administered.

[Series 6: T1 · sagittal · 3.0mm · 0.66mm/px · 6 of 15 slices shown]
[im 1/15]
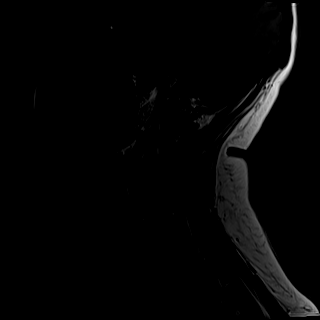
[im 3/15]
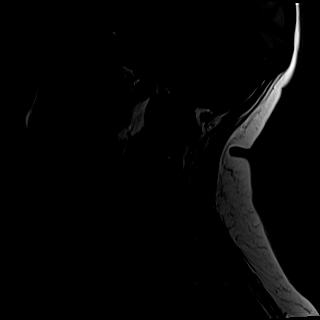
[im 6/15]
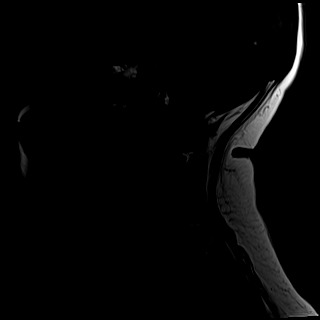
[im 9/15]
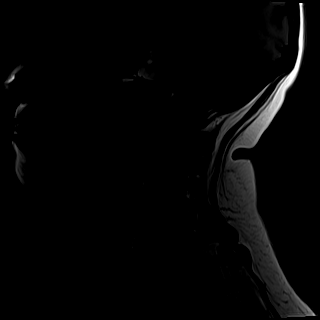
[im 12/15]
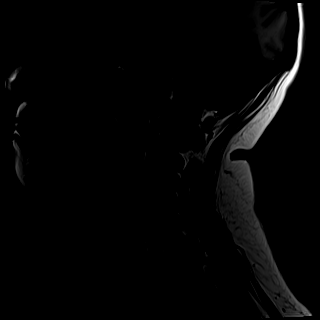
[im 15/15]
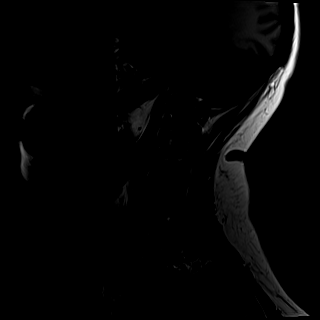

[Series 7: T2 · sagittal · 3.0mm · 0.66mm/px · 7 of 15 slices shown (1 of 2)]
[im 1/15]
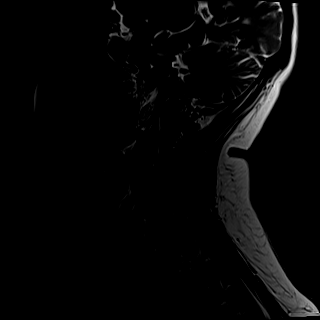
[im 3/15]
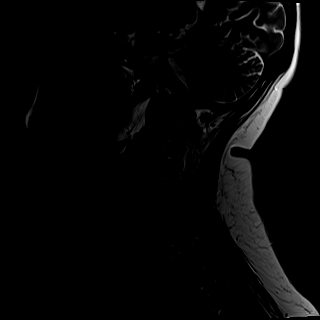
[im 5/15]
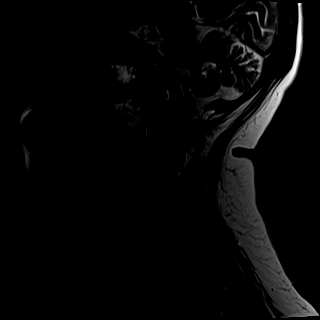
[im 8/15]
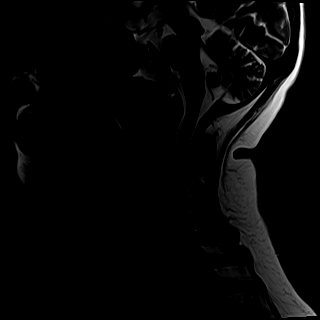
[im 10/15]
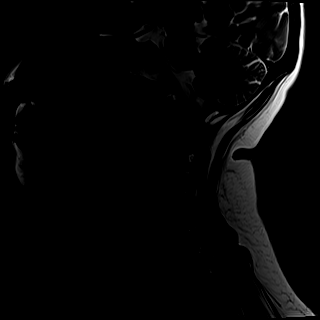
[im 12/15]
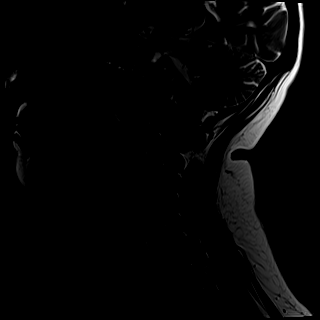
[im 15/15]
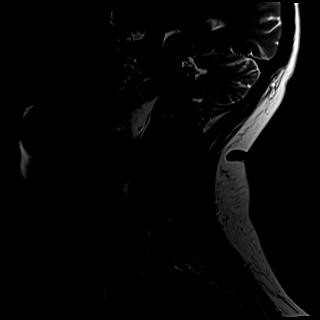

[Series 12: STIR · sagittal · 3.0mm · 0.33mm/px · 6 of 15 slices shown]
[im 1/15]
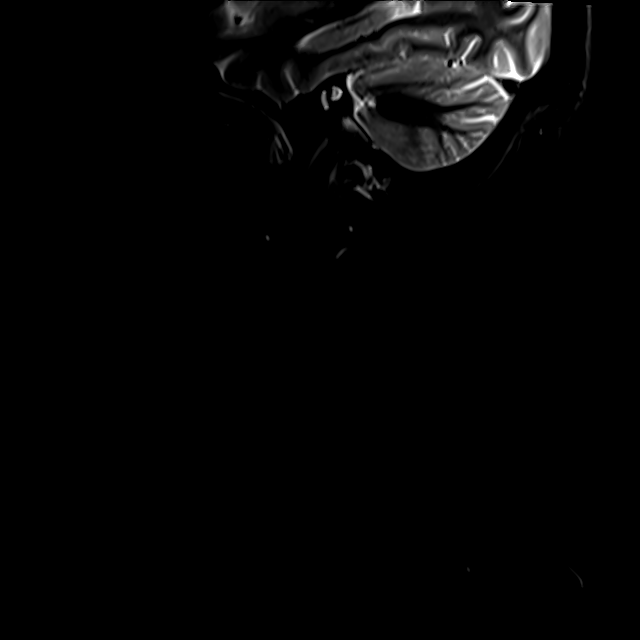
[im 3/15]
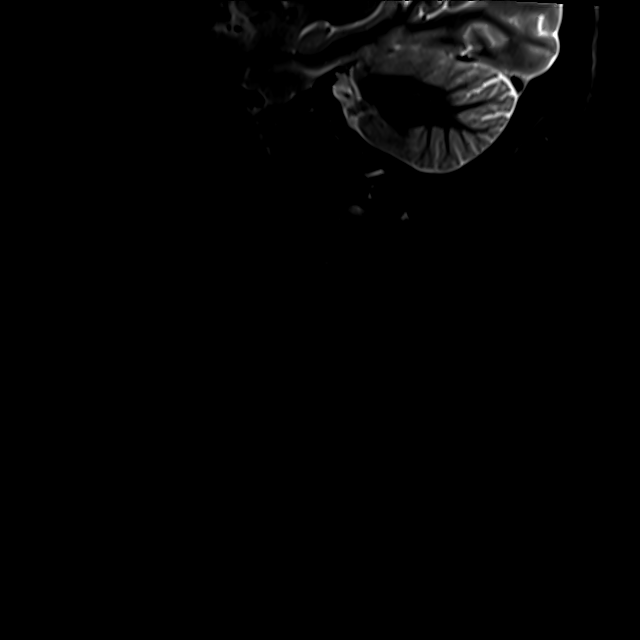
[im 5/15]
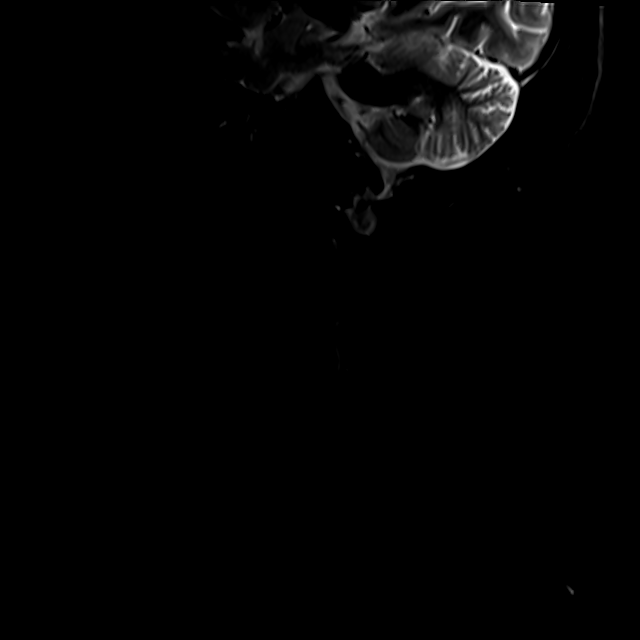
[im 8/15]
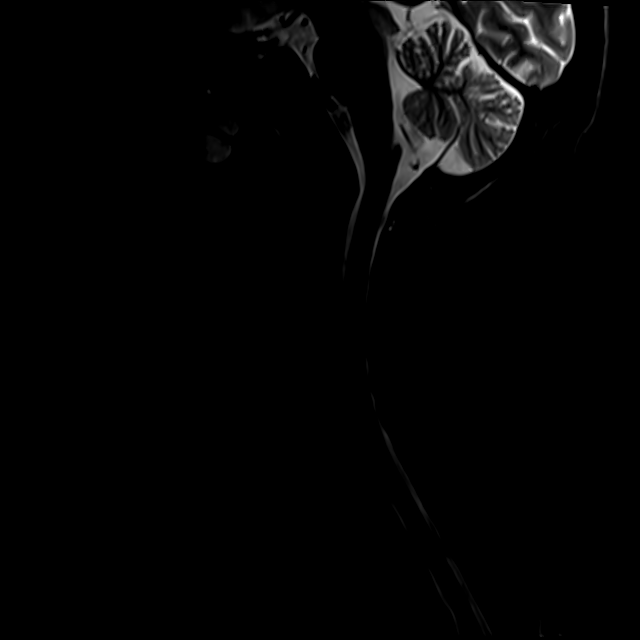
[im 10/15]
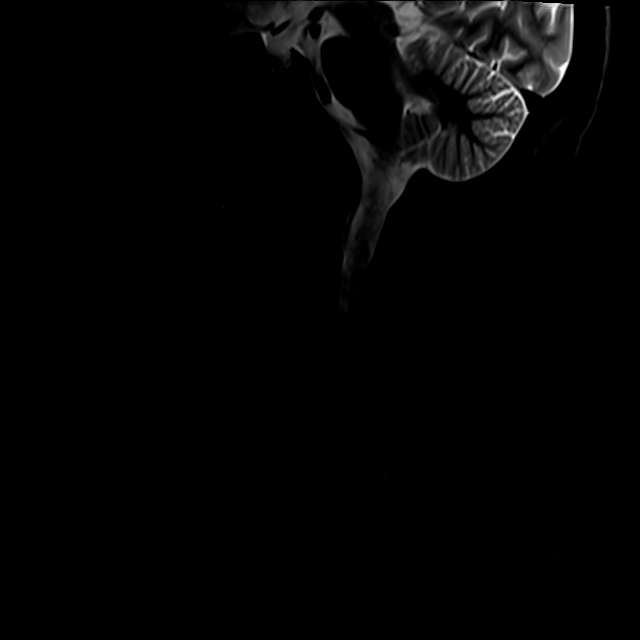
[im 12/15]
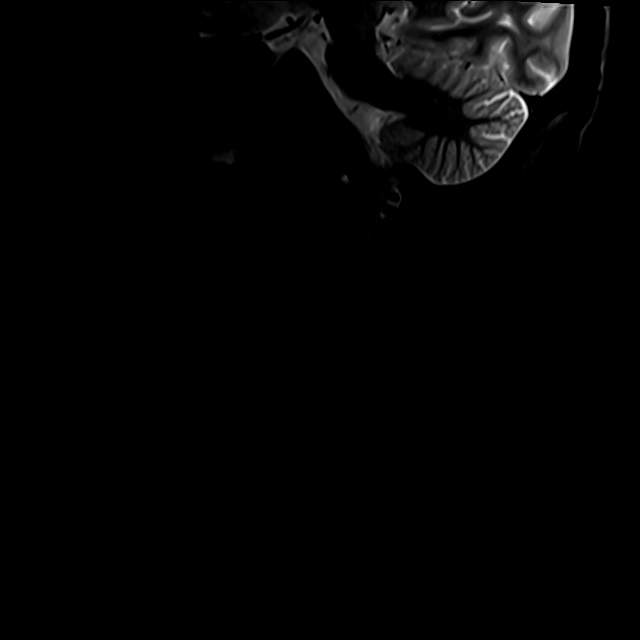

[Series 13: T2 · axial · 3.0mm · 0.50mm/px · z∈[-80,+10]mm · 8 of 29 slices shown (2 of 2)]
[im 1/29]
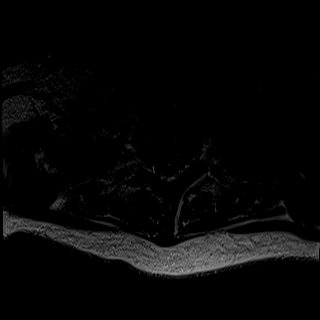
[im 5/29]
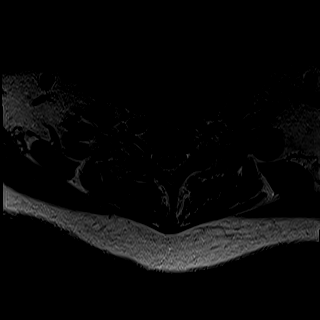
[im 9/29]
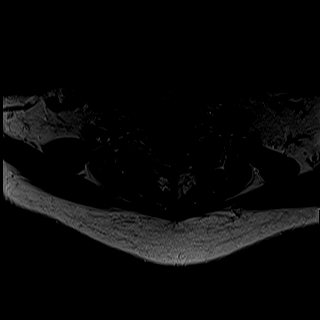
[im 13/29]
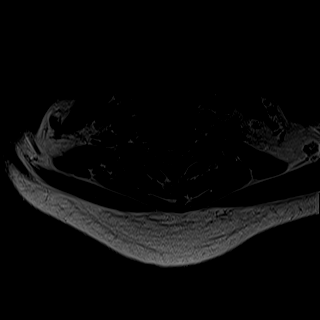
[im 16/29]
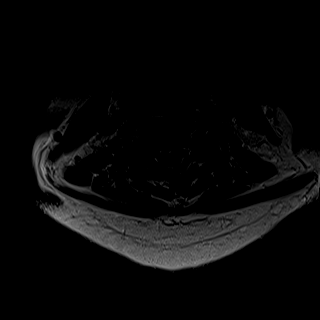
[im 20/29]
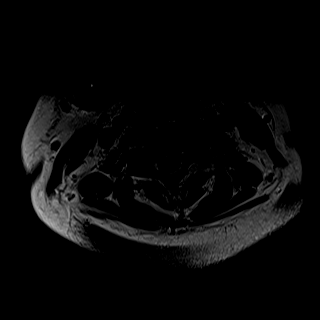
[im 24/29]
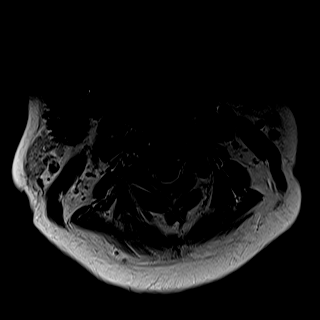
[im 29/29]
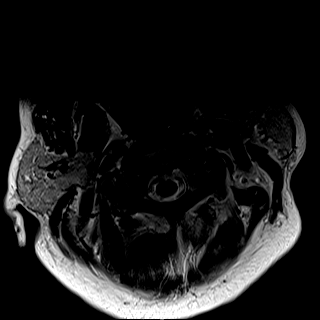

[27 of 48 positions shown; findings below may reference images not displayed]

FINDINGS: Alignment: Normal sagittal alignment.

Vertebrae: Vertebral body heights are maintained. The atlantodens
interval is intact. Disc spaces are preserved. Mildly decreased disc
hydration at C4-5 greater than C5-6. Normal bone marrow signal.

Cord: The cervical cord demonstrates normal signal and caliber.

Posterior Fossa, vertebral arteries, paraspinal tissues: Visualized
posterior fossa is unremarkable. Normal vertebral artery flow voids.
The visualized paraspinal soft tissues are grossly unremarkable.

Disc levels:

C2-3: Mild-to-moderate left facet joint hypertrophy. Minimal
posterior right disc bulge and endplate spurring. Borderline mild
left neuroforaminal stenosis. No central canal stenosis.

C3-4: Minimal posterior disc bulge. Mild left-greater-than-right
facet joint hypertrophy. No central canal or neuroforaminal
stenosis.

C4-5: Mild bilateral facet joint hypertrophy. Mild to moderate
broad-based posterior disc bulge mildly contacts the ventral cord.
CSF is still seen posterior the cord. No central canal stenosis. No
neuroforaminal stenosis.

C5-6: Mild bilateral facet joint hypertrophy. Mild broad-based
posterior disc bulge. No central canal or neuroforaminal stenosis.

C6-7: Mild bilateral facet joint hypertrophy. Minimal posterior disc
bulge. No central canal or neuroforaminal stenosis.

C7-T1: Mild bilateral facet joint hypertrophy. Minimal posterior
disc bulge. No central canal or neuroforaminal stenosis.
IMPRESSION: :
IMPRESSION: 1. Mild multilevel degenerative disc and joint changes as above.
2. Borderline mild right C2-3 neuroforaminal stenosis.
3. C4-5 broad-based posterior disc bulge mildly contacts the ventral
cord. No central canal or neuroforaminal stenosis at this level.

## 2023-11-30 ENCOUNTER — Inpatient Hospital Stay: Payer: PRIVATE HEALTH INSURANCE | Attending: Student in an Organized Health Care Education/Training Program

## 2023-11-30 DIAGNOSIS — R7989 Other specified abnormal findings of blood chemistry: Secondary | ICD-10-CM

## 2023-11-30 MED ADMIN — GADOXETATE DISODIUM 0.25 MMOL/ML IV SOLN: 10 mL | INTRAVENOUS | @ 23:00:00 | Stop: 2023-11-30 | NDC 50419032005

## 2023-12-02 ENCOUNTER — Ambulatory Visit: Payer: PRIVATE HEALTH INSURANCE | Attending: Student in an Organized Health Care Education/Training Program

## 2023-12-04 ENCOUNTER — Telehealth: Payer: PRIVATE HEALTH INSURANCE

## 2023-12-04 NOTE — Progress Notes
 Please inform the patient that there were no worrisome findings on the MRI. We can discuss further at upcoming appointment.     Rae Lips, MD

## 2023-12-04 NOTE — Telephone Encounter
-----   Message from Rae Lips, MD, MS sent at 12/04/2023  7:29 AM PST -----  Please inform the patient that there were no worrisome findings on the MRI. We can discuss further at upcoming appointment.     Rae Lips, MD

## 2023-12-04 NOTE — Telephone Encounter
 Called and relayed Mri results to patient per Dr. Bethann Humble  Patient verbalized understandng

## 2023-12-17 ENCOUNTER — Ambulatory Visit: Payer: PRIVATE HEALTH INSURANCE | Attending: Student in an Organized Health Care Education/Training Program

## 2023-12-17 NOTE — Patient Instructions
 It was a pleasure meeting with you today.   Labs  Schedule Fibroscan [facility]   Repeat MRI through by 06/2024. Schedule through radiology [phone]  Patient Resources: [employer] [email or web address].[email or web address]  Avoid alcohol  Avoid herbal supplements  Tylenol can be used for pain or fevers but should not exceed 2000mg  per day  Please note that we will make our best effort to contact you by phone regarding urgent concerns and critical diagnostic testing  [facility] messaging is intended for the use of basic and simple correspondence pertaining to non urgent matters.   If you have complex concerns or multiple questions, please set up an appointment to review  If you have an inquiry, please provide our staff with a detailed message regarding your question and best way to contact you.     If you choose to obtain diagnostic testing outside of [facility], it is your responsibility to communicate with the office about forwarding your results.    If your treatment plans includes imaging including a CT ab pelvis or MRI, please confirm any requirements including necessary blood work with radiology at the time of scheduling and notify your provider if updated labs will be needed.   Please reach out to your primary care provider for matters that are unrelated to liver disease

## 2023-12-17 NOTE — Progress Notes
 Outpatient Hepatology Follow up      PATIENT: [name] [name]  MRN: [id number]  DOB: 1970-11-06  DATE OF SERVICE: 12/17/2023  REFERRING PRACTITIONER: Rae Lips, MD, MS  PRIMARY CARE PROVIDER: Rojter, Mirian Capuchin, MD  REASON FOR REFERRAL: abnormal Hepatitis B labs  CHIEF COMPLAINT:  abnormal Hepatitis B labs    I met with [name] [name] on 12/17/2023 using the [employer] telemedicine platform.    Subjective:      [name] [name] is a 53 y.o. 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     Additional co-morbidities: H. Pylori    The patient has been see by Dr. Kern Alberta Rojter. As detailed in note, '' In around 2020/2021, patient underwent abdominal ultrasound and was told had fatty liver. Work up as detailed below:     10/05/2021: ''AB [place]: fatty liver, no liver lesions, spleen 9.7cm''  02/12/2022:  AST 93, ALT 121, ALP 112  08/31/2022: PLT 215, AST 84, ALT 69, ALP 164, Tb 0.8, ceruloplasmin 37, Hepatitis B surface Ag positive, Hepatitis B core Ab positive, HCV Ab negative, A1AT MM  10/04/2022 Fibroscan 22.8kPA, CAP 386  10/24/2022 AB [place]: fatty liver, no hepatic lesions, spleen 9.7cm, no ascites   11/09/2022: AST 51, ALT 46, ALP 172, Tb 0.8, Hepatitis B e Ag negative, HBV PCR negative, HDV Ab positive   Met with Dr. Lorenso Quarry to complete HDV PCR labs but I do not have the results.   OSH hospitalization 4/15-4/20/2024 for left sided abdominal pain and left flank pain associated with watery diarrhea. Underwent CT ab pelvis which described prominent appendix and thickening of cecum. Also underwent an EGD but do not have the results. Notes mention gastritis and gastric polyp  Weight was 152lbs  on 01/04/2023 and now 145lbs.   Met with Dr. Riley Kill 04/25/2023 for abdominal pain   Started Hepatitis B vaccination with PCP and is due for second dose in October 2024.   For arthritis and eyes was started on prednisone and plaquenil for Sjogren.     With today's encounter, the patient is accompanied by her son. Spanish ID [id number]. Still on plaquenil and prednisone 5mg  per rheumatology. Has upcoming appt with rheumatology due to joint pain. Weight is same. Gastroenterology Dr. Riley Kill.     Alcohol: does not consume alcohol     Supplements: none    Family History  Brother: liver disease  Uncle: died from liver disease    Social History     Socioeconomic History    Marital status: Married   Tobacco Use    Smoking status: Never    Smokeless tobacco: Never   Substance and Sexual Activity    Alcohol use: Never    Drug use: Never     Social Drivers of Health     Physical Activity: Not at Risk (01/21/2023)    Received from [place], [place]    Physical Activity     Physical Activity: 1   Stress: Not at Risk (01/21/2023)    Received from [place], [place]    Stress     Stress: 1   Financial Resource Strain: Not at Risk (01/21/2023)    Received from [place], [employer]     Financial Resource Strain: 1     Outpatient Medications Prior to Visit   Medication Sig    amLODIPine 5 mg tablet Take 1 tablet (5 mg total) by mouth.    atorvastatin 20 mg tablet Take 1 tablet (20  mg total) by mouth.    hydroxychloroquine 200 mg tablet Take 1 tablet (200 mg total) by mouth daily.    losartan 100 mg tablet Take 1 tablet (100 mg total) by mouth.    LUMIGAN 0.01 % ophthalmic solution Place 1 drop into the right eye.    omeprazole 40 mg DR capsule Take 1 capsule (40 mg total) by mouth daily.    pilocarpine 5 mg tablet Take 1 tablet (5 mg total) by mouth three (3) times daily.    prednisoLONE acetate 1% ophthalmic suspension Place 1 drop into both eyes three (3) times daily.    predniSONE 5 mg tablet Take 1 tablet (5 mg total) by mouth.    loratadine 10 mg tablet Take 1 tablet (10 mg total) by mouth. (Patient not taking: Reported on 12/17/2023)    polyethylene glycol powder Take 17 g by mouth daily. (Patient not taking: Reported on 12/17/2023)     No facility-administered medications prior to visit.     Allergies   Allergen Reactions Lisinopril Other (See Comments)     Headache x 5 months with medication    Hydrochlorothiazide        Review of Systems:   14 point Complete ROS is negative except for above      Objective:      BP 159/78  ~ Pulse 76  ~ Temp (!) 35.8 ?C (96.4 ?F) (Temporal)  ~ Ht 5' (1.524 m)  ~ Wt 148 lb (67.1 kg)  ~ SpO2 96%  ~ BMI 28.90 kg/m?   Gen: NAD, AAO4  HEENT: no scleral icterus  AB: central fat deposition, nontender, nondistended, no hepatomegaly, no splenomegaly, no ascites  Ext: no peripheral edema  MSK: well nourished  SKIN: no jaundice    Lab Review:   02/12/2022  AST 93, ALT 121, ALP 112, Tb 0.8    08/31/2022  PLT 215  AST 84, ALT 69, ALP 164, Tb 0.8  Hepatitis A Ab reactive  Hepatitis B surface Ag reactive   Hepatitis B core AB reactive  HCV AB negative  TS 19%  SMA negative  Ceruloplasmin 37    11/09/2022  PLT 157  Creatinine 0.64, AST 51, ALT 46, ALP 172, Tb 0.8  Hepatitis B e Ag negative, Hepatitis B e AB nonreactive  HBV PCR negative   Hepatitis Delta Ab positive   HIV negative  AFP L3 10.3%    01/04/2023  Hepatitis B core Ab negative  AMA <1:20    01/14/2023  WBC 3.5, Hb 11.2, PLT 216  AST 40, ALT 34, ALP 157, Tb 0.7  INR 0.99    01/15/2023  AST 40, ALT 36, ALP 162, Tb 0.8    01/17/2023  Lipase 74  Hepatitis B surface Ag negative  Hepatitis B e Ag negative  Hepatitis B eAb negative  HBV PCR negative   Hepatitis Delta IgM abnormal   HCV AB negative  AMA negative  TS 13%    01/19/2023  Na 138, creatinine 0.7    02/27/2023  PLT 197  AST 76, ALT 64, ALP 214, Tb 0.7  Hepatitis B core Ab negative  Hepatitis B surface Ab negative  HBV PCR negative    05/24/2023  AST 45, ALT 34, ALP 195, Tb 0.6    07/25/2023  PLT 190  AST 26, ALT 23, ALP 150, Tb 0.8  INR 1.1  A1c 5.7    Imaging notable for:   AB [place] 10/05/2021  Hyperechoic and heterogenous echotexture of the liver.  No intrahepatic lesions  Spleen is 9.7cm  Impression  Fatty infiltration of the liver     Fibroscan 10/04/2022  CAP 286, 22.8kPA    AB [place] 10/24/2022  Hyperechoic and heterogenous echotexture of the liver. No intrahepatic lesions   Spleen is normal at 9.7cm  Impression:   Fatty infiltration of the liver     CT ab pelvis 01/14/2023  Prominent appendix without inflammatory changes. Acute appendicitis not excluded.   Thickening of the cecum through hepatic flexure without associated inflammatory changes. May represent colitis.     AB [place] 01/15/2023  Liver demonstrates heterogenous attenuating echotexture spanning 15.1cm in greatest long axis dimensions. There is no intrahepatic biliary dilation. The portal vein is patent with normal directional flow. The common duct measures 3mm within normal limits. The gallbladder is unremarkable. Limited evaluation of the pancreas, IVC, aorta  No ascites.     CT ab pelvis 06/20/2023  1.  No definite CT findings to explain abdominal pain/weight loss.  2.  Mildly irregular hepatic surface contour, likely reflects chronic liver disease. No focal liver lesion. Small perisplenic collaterals, but otherwise no signs of portal hypertension.  3.  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 The [employer] of Gastroenterology Guidelines recommend contrast enhanced MRCP in one year to ensure stability.    MRI 12/03/2023  1. Mild hepatic steatosis with evidence of mild fibrosis. No focal liver mass or evidence of portal hypertension.     2. 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 The [employer] of Gastroenterology Guidelines recommend contrast enhanced MRCP in 1-2 years to ensure   stability.*     Procedure  EGD 01/17/2023           Assessment & Plan:   [name] [name] is a 53 y.o. female with MASLD, who is referred for abnormal Hepatitis B labs.     # MASLD  # Abnormal liver tests  # Abnormal imaging of the liver   The patient has exhibited elevated liver tests with hepatocellular pattern over time. These are improving.     While there was an initial concern for Hepatitis B based on serologies 08/2022 and query of Hepatitis D, it appears that repeat labs by 12/2022 was not consistent with Hepatitis B, and as such making Hepatitis D unlikely. Unclear if she had a transient Hepatitis B exposure.     OSH Fibroscan 10/2022 notable for 22.8kPA for CAP286 raising concern for cirrhosis. She also concurrent risk factors for MASLD so will have to distinguish abnormal liver tests related to MASLD. Other considerations include AMA negative PBC. She does not have a history of hepatic decompensation. She was started on prednisone for rheumatologic condition. Liver tests are improving but unclear if the prednisone is treating an underlying liver disease or if this is coincidental since has made some lifestyle changes. Additionally, it could be that prednisone may have been helping with joints and that could have helped the elevated ALP.     Recommend lifestyle modifications including avoidance of alcohol, diet and exercise with goal toward weight reduction of at least 10% of body weight to observe improvement in liver tests and any steatosis or fibrosis.   Continue to monitor liver chemistry tests over time  Repeat Fibroscan  Maintain HCC screening until fibrosis state is clarified  Repeat imaging by 06/2024  Referral to Clinical Nutrition   Avoid alcohol  Avoid herbal supplement    # Pancreas cyst  Small and noted on CT ab pelvis  Monitor with MRI annually     Follow up: 3-4 months     37 minutes were spent personally by me today on this encounter which include today's pre-visit review of the chart, obtaining appropriate history, performing an evaluation, documentation and discussion of management with details supported within the note for today's visit. The time documented was exclusive of any time spent on any separately billed procedure.  Thank you for the opportunity to participate in the care of this patient. Please feel free to contact me with any questions or concerns.      Rae Lips, MD  Transplant Hepatologist  [facility], [facility], [place]

## 2023-12-19 ENCOUNTER — Telehealth: Payer: PRIVATE HEALTH INSURANCE

## 2023-12-19 NOTE — Telephone Encounter
-----   Message from Rae Lips, MD, MS sent at 12/19/2023  3:32 PM PDT -----  Please inform patient that liver tests are improving.     Rae Lips, MD

## 2023-12-19 NOTE — Telephone Encounter
 LVM informing patient of Dr. Bethann Humble response.

## 2024-01-10 ENCOUNTER — Inpatient Hospital Stay: Payer: PRIVATE HEALTH INSURANCE | Attending: Gastroenterology

## 2024-01-10 DIAGNOSIS — R1310 Dysphagia, unspecified: Secondary | ICD-10-CM

## 2024-01-10 MED ADMIN — BARIUM SULFATE 98 % PO SUSR: ORAL | @ 22:00:00 | Stop: 2024-01-10

## 2024-01-10 MED ADMIN — BARIUM SULFATE 60 % PO SUSP: 80 mL | ORAL | @ 22:00:00 | Stop: 2024-01-10 | NDC 32909018602

## 2024-01-13 ENCOUNTER — Telehealth: Payer: PRIVATE HEALTH INSURANCE

## 2024-01-13 NOTE — Telephone Encounter
 MD message was relayed to the patient. Per patient she will further discuss during her follow up appointment tomorrow, 01/04/24. No further questions asked.

## 2024-01-13 NOTE — Telephone Encounter
-----   Message from Charline Contras, MD sent at 01/11/2024  9:11 PM PDT -----  [name] [name]   Can you let her know that esophagram was normal   We talked about doing an egd this year as a follow up to the outside on she had last year, if she wants to schedule I can order it otherwise we should schedule a follow up  Thanks   [name] [name]

## 2024-01-14 ENCOUNTER — Telehealth: Payer: PRIVATE HEALTH INSURANCE

## 2024-01-14 ENCOUNTER — Ambulatory Visit: Payer: PRIVATE HEALTH INSURANCE | Attending: Gastroenterology

## 2024-01-14 DIAGNOSIS — R1310 Dysphagia, unspecified: Secondary | ICD-10-CM

## 2024-01-14 NOTE — Progress Notes
 OUTPATIENT GASTROENTEROLOGY FOLLOW UP    Date of service: 01/14/2024  Reason for consult: LUQ pain   Referring Provider: Matthew Saras., MD  PCP: Rojter, Mirian Capuchin, MD    HISTORY OF PRESENT ILLNESS:   53 y.o. female who presents for LUQ pain. History of scleroderma, overlap RA/sjogrens/limited sclerosis    Chronic LUQ which became more severe at the beginning of this year.   Presented to outside hospital in April with pain and diarrhea. CT showed cecal wall thickening. Treated with ceftriaxone and flagyl. Reportedly also treated for pancreatitis although CT did not show pancreatitis.   Outside EGD showed H pylori gastritis. She reports that it was treated with antibiotics.   She reports constipation. Tries to manage with fruits. BM every day. However +straining and discomfort.   Colonoscopy in June was unremarkable.     Interval 10/15/23:  Accompanied by daughter Darien Ramus   HP breath test negative 05/2023  Continues to have LUQ pain; it is about 5/10 in severity   Better with food   She is taking omeprazole 20mg  daily   Reports dysphagia - as if food is getting stuck in throat - has to clear throat   She has sicca and is using pilocarpine 5mg  TID   +Dysphonia     Interval 01/14/24:  Denies abdominal pain   Continues to have occasional dysphagia, needs to follow food with water   Esophagram was unremarkable       PAST MEDICAL HISTORY:   No past medical history on file.    PAST SURGICAL HISTORY:   No past surgical history on file.    ALLERGIES:    Lisinopril and Hydrochlorothiazide    HOME MEDICATIONS:     Outpatient Medications Prior to Visit   Medication Sig    amLODIPine 5 mg tablet Take 1 tablet (5 mg total) by mouth.    atorvastatin 20 mg tablet Take 1 tablet (20 mg total) by mouth.    hydroxychloroquine 200 mg tablet Take 1 tablet (200 mg total) by mouth daily.    loratadine 10 mg tablet Take 1 tablet (10 mg total) by mouth. (Patient not taking: Reported on 12/17/2023)    losartan 100 mg tablet Take 1 tablet (100 mg total) by mouth.    LUMIGAN 0.01 % ophthalmic solution Place 1 drop into the right eye.    pilocarpine 5 mg tablet Take 1 tablet (5 mg total) by mouth three (3) times daily.    polyethylene glycol powder Take 17 g by mouth daily. (Patient not taking: Reported on 12/17/2023)    prednisoLONE acetate 1% ophthalmic suspension Place 1 drop into both eyes three (3) times daily.    predniSONE 5 mg tablet Take 1 tablet (5 mg total) by mouth.     No facility-administered medications prior to visit.        FAMILY HISTORY:   No family history on file.    SOCIAL HISTORY:     Social History     Tobacco Use    Smoking status: Never    Smokeless tobacco: Never   Substance Use Topics    Alcohol use: Never    Drug use: Never       REVIEW OF SYSTEMS:   A 14- point review of systems was negative except where noted in the HPI.    PHYSICAL EXAM:   There were no vitals taken for this visit.    Gen: No acute distress, answers questions appropriately.  Eyes: Anicteric sclera, no conjunctival pallor  Neck: Trachea midline, good range of motion  Resp: normal WOB   MSK: No peripheral edema, clubbing, or cyanosis   Neuro: Alert and oriented, grossly nonfocal, moving all extremities   Skin: Warm and well perfused, no rashes  Psych: Normal mood and affect    LABORATORY DATA:     Lab Results   Component Value Date    WBC 4.85 12/17/2023    HGB 12.5 12/17/2023    HCT 40.6 12/17/2023    PLT 192 12/17/2023     Lab Results   Component Value Date    AST 36 12/17/2023    AST 45 05/24/2023    AST 76 (H) 02/27/2023    ALT 26 12/17/2023    ALT 34 05/24/2023    ALT 64 02/27/2023    BILITOT 0.6 12/17/2023    BILITOT 0.6 05/24/2023    BILITOT 0.7 02/27/2023    ALKPHOS 159 (H) 12/17/2023    ALKPHOS 195 (H) 05/24/2023    ALKPHOS 214 (H) 02/27/2023    INR 1.0 12/17/2023    ALBUMIN 4.5 12/17/2023    AFP 3 12/17/2023    AFP 2.5 12/17/2023    AFP 3 05/24/2023      Lab Results   Component Value Date    CREAT 0.69 12/17/2023    BUN 12 12/17/2023    NA 139 12/17/2023 K 4.2 12/17/2023    CL 106 12/17/2023    CO2 23 12/17/2023    CALCIUM 9.4 12/17/2023     No results found for: ''HBVQPCR''  No results found for: ''HCVQPCR''    RADIOLOGY:     Esophagram 01/10/24:      FINDINGS:     Esophageal caliber: Normal with no focal narrowing.     Intraluminal filling defects: None.     Esophageal motility: Normal.     Hiatal hernia: None.     Gastroesophageal reflux: None observed.        IMPRESSION:       Normal esophagram.      CT ab pelvis 01/14/2023:          ENDOSCOPY:     Colonoscopy 03/08/23:    FINDINGS:   Normal TI. Diminutive transverse colon polyp removed with cold forceps polypectomy. Sigmoid diverticulosis. The colonoscopy examination was otherwise normal.        COLON, TRANSVERSE, POLYP (BIOPSY):   - Colonic mucosa with prominent benign appearing lymphoid aggregate  - No dysplasia (multiple deeper levels examined)            ASSESSMENT/RECOMMENDATIONS:     Problem List Items Addressed This Visit    None      LUQ/flank pain. History of H pylori, eradication confirmed on UBT. Feeling better on omeprazole 40mg  daily.   Gastric IM. EGD with mapping.   Dysphagia and dysphonia. Suspect secondary to sicca (responding to pilocarpine and appears to be at max dose). Esophagram normal. Discussed further testing with HREM and she would like to proceed. She understands that this is unlikely to change management if we find ineffective motility as would be expected with scleroderma.     Recommend:   EGD with gastric mapping and HREM     Orders Placed This Encounter    GI Endoscopy Procedures       Levora Dredge, MD   [facility]     Problem:   []  New minor/self-limited problem  []  Acute uncomplicated illness/injury   []   Acute systemic illness or complicated injury   []   New problem with uncertain diagnosis  []  New problem that poses threat to life or bodily function  []   2 chronic stable problems   [x]   1 chronic unstable problem     Review of Data:   I have   []  Reviewed/ordered []  1 []  2 []  >= 3 unique laboratory, radiology, and/or diagnostic tests noted above   []  Reviewed []  1 []  2 []  >= 3 prior external notes and incorporated into patient assessment   []  Discussed management or test interpretation with external provider(s) as noted     Risk of Complication:   This Clinical research associate has deemed the above diagnoses to have a risk of complication, morbidity or mortality of:   []  Minimal;    []  Low;     [x]  Moderate;     []  Severe

## 2024-01-14 NOTE — Telephone Encounter
 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 [facility] please add the patient to a procedure wait list. GI Scheduler to contact patient with sooner availability if openings become available.     Special Instructions:        Staff, please check off all the options applicable before routing the encounter:       [x]  Patient has been informed about the procedure date and time.     [x]  Patient has been provided check in instructions, to bring responsible adult to procedure.    [x]  Provided Prep Instructions, based on orders.     []  Reviewed if RX prep was prescribed if applicable to [facility].      []  Ensured insurance turnaround STAT, or Routine were flagged accordingly.     []  If [facility] patient, offered Monday Conscious Sedation, if not willing to pay 200.00 for MAC. LOCATION: [facility] ONLY     []   Any other type of insurance (Except Medical), please inform patient, MAC is usually a non-covered service. $200.00 is the fee for the professional Anesthesia services.  Conscious Sedation is included in their procedure, but not offered at [facility], or [facility].   Only at [employer] locations, such as [address], [place], [place].         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       Reminder to all Clinic Staff?If you are coordinating specific procedure time and date with patient and MD, it is your responsibility to confirm these details with the patient. If MPU schedulers are communicating directly with the patient it is their responsibility to confirm the appointment.              MAC SCRIPT  As you may be aware, there are two sedation options that are used for colonoscopy and upper gastrointestinal endoscopy. One form of sedation consists of a combination of medicines (Versed and Fentanyl) administered by a registered nurse (RN) under the supervision of your gastroenterologist.  This form of sedation is included in your gastroenterologist?s fee.   The other form of sedation is ?intravenous sedation? with the use of Propofol (sedative) administered by a specially trained physician called an anesthesiologist, also known as MAC (Monitored Anesthesia Care). Propofol has a rapid onset and short duration, compared to Versed and Fentanyl, and provides complete sedation, whereas with conscious sedation this is not guaranteed. Propofol also has potent anti-nausea properties, so one tends to awake quickly and without any ill effects.               Effective July 1st, 2017 the [facility] Procedure unit in [facility] and [place] will only perform cases with Monitored Anesthesia Care (Propofol) for endoscopy procedures.   This type of sedation is not always covered by health plans. We will submit an authorization request if necessary for this type of sedation,  but approval will be based on the medical guideline for that insurance carrier, and  there are limited medical conditions that would be considered appropriate to cover an anesthesiologist which include ?severe systematic diseases? such as significant heart and/or lung diseases, sever insulin dependent diabetes, sleep apnea, and morbid obesity. Other conditions that may require an anesthesiologist include a history of drug or alcohol abuse, chronic use of Valium type medications such as Ativan, Xanax, or Clonopin, or a prior documented history of previous problems with conscious sedations (Versed and Fentanyl) or anesthesia.  Please refer to your health plan medical guideline for specifics on their anesthesia coverage determination.

## 2024-01-14 NOTE — Patient Instructions
 Please call [phone] to schedule an upper endoscopy with esophageal manometry in [facility]     High-resolution esophageal manometry    The esophagus is a long, muscular tube that connects the throat to the stomach. It contracts with swallowing to push food from the throat to the stomach. Esophageal manometry is a test that evaluates how well the esophagus works by measuring pressures produced by esophageal muscle contraction in response to swallowing. It is used to evaluate swallowing problems not caused by mechanical obstruction of the esophagus, chest pain not related to the heart, for preoperative evaluation to make sure the esophagus functions well enough to do anti-reflux surgery, and to assure correct placement of an esophageal pH catheter.    The test is accomplished with a thin, flexible catheter that has up to 36 pressure sensors spaced at 1 cm intervals along its length. It is attached to a computer and video monitor that display and store pressure information coming from the sensors. The test is performed by specially trained and experienced motility nurses while you are awake, so that you can participate. After the nasal passage is numbed by an anesthetic gel, the catheter is passed into the nose and swallowed into the esophagus by drinking water. It is placed so pressure sensors are positioned down the back of the throat to the stomach. Esophageal function is evaluated by giving you small amounts of liquid, a jello-like material, and sometimes solid food to swallow. The catheter is removed at completion of the study, and the data are stored on a computer for analysis. The whole process takes about 30 minutes. Once this has been completed, you may drive yourself home and go about your usual activities. The test is interpreted by gastroenterologists who are experts in esophageal diseases.    Esophageal manometry is used to diagnose or evaluate: Achalasia, acid reflux/GERD, cricopharyngeal bar, esophageal spasm, dysphagia, jackhammer esophagus, hiatal hernia, chest pain, supragastric belching, rumination, scleroderma, pre-operative evaluations for lung transplant, and pre-operative evaluations for anti-reflux surgery.

## 2024-01-15 ENCOUNTER — Ambulatory Visit: Payer: PRIVATE HEALTH INSURANCE | Attending: Student in an Organized Health Care Education/Training Program

## 2024-01-15 NOTE — Telephone Encounter
 Pt made aware appt needs to be scheduled at [facility]. Pt verbally understood and will call to schedule appt.

## 2024-01-15 NOTE — Procedures
 FIBROSCAN:     FibroScan / Vibration Controlled Transient Elastography (VCTE) Report    Patient:[name] [name]  Age : 53 y.o.  Date: 01/15/2024  Probe size :  XL+           MRN: [id number]  Referring Physician : Celso College, MD  Operator : Andriette Keeling LVN   Indication (underlying disease/condition):  MASLD        Procedure: FibroScan VCTE test procedure was explained to the patient in detail. Patient was placed in supine position with right arm in maximum abduction to allow optimal exposure of right lateral chest wall and abdomen.  The point of examination (located at the junction of transverse line from xiphisternum extending laterally to the right mid-axillary line) was identified and marked. Patient was instructed to breathe normally and remain stationary during the test process. Pre-measurement data confirmed the transient elastography probe was centered over the liver parenchyma. A series of ten 50Hz  mechanical pulses were applied with controlled application pressure to induce a mechanical shear wave in the liver tissue. For each measurement, the shear wave propagation speed was detected, displayed and converted to its equivalent liver stiffness value in kilopascals. Skin to liver capsule distance and shear wave characteristics were monitored during the entire examination to assure data quality. Median liver stiffness measurement and interquartile range were calculated and displayed in real time. Acquired measurement data was stored and submitted to the physician for review and interpretation.  Patient tolerated the procedure well and was discharged without incident.        Interpretation:  This Liver Stiffness Score is suggestive of:  -  F2 Fibrosis      Interpretation: The CAP Score is suggestive of:   -  S2 Steatosis      Recommendations:      Results may not be an accurate reflection of patient's fibrosis stage and fat content in the setting of active inflammation of the liver.     Liver biopsy remains the gold standard. Consider fibrosis testing after resolution of acute inflammation      Ultimately diagnosis of liver fibrosis severity would be up to the referring physician given the sensitivity and specificity of the Fibroscan is not 100% and vary depending on disease process. Clinical correlation indicated by referring physician.    Follow up with referring MD, message and report sent to referring MD.

## 2024-01-16 ENCOUNTER — Telehealth: Payer: PRIVATE HEALTH INSURANCE

## 2024-01-16 NOTE — Telephone Encounter
 Telephone Note    Utilized Spanish interpreter ID [phone] to contact patient to review results of Fibroscan, however, reached voicemail. Results appear better compared to the findings originally obtained by Dr. Ramonita Burow    Celso [employer], MD

## 2024-01-16 NOTE — Telephone Encounter
 Called and relayed fibroscan results to patient. Per Dr. Ludwig Safer  Hi team,    I attempted to contact patient to review results of Fibroscan but was not able to reach her. Please inform her that results appear better than findings obtained by Dr. Ramonita Burow. Instead of cirrhosis, we found a more moderate degree of scarring. She may be a candidate for a medication to help further with scarring which we can discuss in clinic.    She can discuss further at her scheduled appointment or she may make an earlier appointment.    Celso [employer], MD     Patient verbalized understanding

## 2024-01-20 ENCOUNTER — Ambulatory Visit: Payer: PRIVATE HEALTH INSURANCE

## 2024-03-23 ENCOUNTER — Ambulatory Visit: Payer: PRIVATE HEALTH INSURANCE | Attending: Student in an Organized Health Care Education/Training Program

## 2024-03-24 ENCOUNTER — Telehealth: Payer: PRIVATE HEALTH INSURANCE

## 2024-03-24 ENCOUNTER — Ambulatory Visit: Payer: PRIVATE HEALTH INSURANCE | Attending: Student in an Organized Health Care Education/Training Program

## 2024-03-24 NOTE — Patient Instructions
 It was a pleasure meeting with you today.   Labs  MRI by 06/2024. Schedule through radiology [phone]  Patient Resources:  [employer] Liver Foundation https://liverfoundation.org/  [email or web address]  We discussed Resmetirom [email or web address]  Avoid alcohol  Avoid herbal supplements  Tylenol can be used for pain or fevers but should not exceed 2000mg  per day  Please note that we will make our best effort to contact you by phone regarding urgent concerns and critical diagnostic testing  [facility] messaging is intended for the use of basic and simple correspondence pertaining to non urgent matters.   If you have complex concerns or multiple questions, please set up an appointment to review  If you have an inquiry, please provide our staff with a detailed message regarding your question and best way to contact you.     If you choose to obtain diagnostic testing outside of [facility], it is your responsibility to communicate with the office about forwarding your results.    If your treatment plans includes imaging including a CT ab pelvis or MRI, please confirm any requirements including necessary blood work with radiology at the time of scheduling and notify your provider if updated labs will be needed.   Please reach out to your primary care provider for matters that are unrelated to liver disease

## 2024-03-24 NOTE — Progress Notes
 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     Additional co-morbidities: H. Pylori    The patient has been see by Dr. Unice Rojter. As detailed in note, '' In around 2020/2021, patient underwent abdominal ultrasound and was told had fatty liver. Work up as detailed below:     10/05/2021: ''AB US : fatty liver, no liver lesions, spleen 9.7cm''  02/12/2022:  AST 93, ALT 121, ALP 112  08/31/2022: PLT 215, AST 84, ALT 69, ALP 164, Tb 0.8, ceruloplasmin 37, Hepatitis B surface Ag positive, Hepatitis B core Ab positive, HCV Ab negative, A1AT MM  10/04/2022 Fibroscan 22.8kPA, CAP 386  10/24/2022 AB US : fatty liver, no hepatic lesions, spleen 9.7cm, no ascites   11/09/2022: AST 51, ALT 46, ALP 172, Tb 0.8, Hepatitis B e Ag negative, HBV PCR negative, HDV Ab positive   Met with Dr. Geraldean to complete HDV PCR labs but I do not have the results.   OSH hospitalization 4/15-4/20/2024 for left sided abdominal pain and left flank pain associated with watery diarrhea. Underwent CT ab pelvis which described prominent appendix and thickening of cecum. Also underwent an EGD but do not have the results. Notes mention gastritis and gastric polyp  Weight was 152lbs  on 01/04/2023 and now 145lbs.   Met with Dr. Nulsen 04/25/2023 for abdominal pain   Started Hepatitis B vaccination with PCP and is due for second dose in October 2024.   For arthritis and eyes was started on prednisone and plaquenil for Sjogren.     With today's encounter, the patient is accompanied by her son. Still on plaquenil. No longer on prednisone 5mg . Weight is same. Gastroenterology Dr. Nulsen.     Alcohol: does not consume alcohol     Supplements: none    Family History  Brother: liver disease  Uncle: died from liver disease    Social History     Socioeconomic History    Marital status: Married   Tobacco Use    Smoking status: Never    Smokeless tobacco: Never   Substance and Sexual Activity    Alcohol use: Never    Drug use: Never     Social Drivers of Health     Physical Activity: At Risk (02/21/2024)    Received from [facility]    Physical Activity     Physical Activity: 2   Stress: Not at Risk (02/21/2024)    Received from [facility]    Stress     Stress: 1   Financial Resource Strain: Not at Risk (02/21/2024)    Received from [employer]     Financial Resource Strain: 1     Outpatient Medications Prior to Visit   Medication Sig    amLODIPine 5 mg tablet Take 1 tablet (5 mg total) by mouth.    aspirin 81 mg EC tablet Take 1 tablet (81 mg total) by mouth daily.    atorvastatin 20 mg tablet Take 1 tablet (20 mg total) by mouth.    hydroxychloroquine 200 mg tablet Take 1 tablet (200  mg total) by mouth daily.    losartan 100 mg tablet Take 1 tablet (100 mg total) by mouth.    LUMIGAN 0.01 % ophthalmic solution Place 1 drop into the right eye.    pilocarpine 5 mg tablet Take 1 tablet (5 mg total) by mouth three (3) times daily.    prednisoLONE acetate 1% ophthalmic suspension Place 1 drop into both eyes three (3) times daily.    RESTASIS 0.05 % ophthalmic emulsion Place 1 drop into both eyes two (2) times daily.    predniSONE 5 mg tablet Take 1 tablet (5 mg total) by mouth.    loratadine 10 mg tablet Take 1 tablet (10 mg total) by mouth. (Patient not taking: Reported on 03/24/2024)    polyethylene glycol powder Take 17 g by mouth daily. (Patient not taking: Reported on 03/24/2024)     No facility-administered medications prior to visit.     Allergies   Allergen Reactions    Lisinopril Other (See Comments)     Headache x 5 months with medication    Hydrochlorothiazide Review of Systems:   14 point Complete ROS is negative except for above      Objective:      BP 150/77 (BP Location: Left arm, Patient Position: Sitting, Cuff Size: Regular)  ~ Pulse 73  ~ Temp 36.6 ?C (97.8 ?F) (Temporal)  ~ Ht 5' (1.524 m)  ~ Wt 150 lb (68 kg)  ~ SpO2 97%  ~ BMI 29.29 kg/m?   Gen: NAD, AAO4  HEENT: no scleral icterus  AB: central fat deposition, nontender, nondistended, no hepatomegaly, no splenomegaly, no ascites  Ext: no peripheral edema  MSK: well nourished  SKIN: no jaundice    Lab Review:   02/12/2022  AST 93, ALT 121, ALP 112, Tb 0.8    08/31/2022  PLT 215  AST 84, ALT 69, ALP 164, Tb 0.8  Hepatitis A Ab reactive  Hepatitis B surface Ag reactive   Hepatitis B core AB reactive  HCV AB negative  TS 19%  SMA negative  Ceruloplasmin 37    11/09/2022  PLT 157  Creatinine 0.64, AST 51, ALT 46, ALP 172, Tb 0.8  Hepatitis B e Ag negative, Hepatitis B e AB nonreactive  HBV PCR negative   Hepatitis Delta Ab positive   HIV negative  AFP L3 10.3%    01/04/2023  Hepatitis B core Ab negative  AMA <1:20    01/14/2023  WBC 3.5, Hb 11.2, PLT 216  AST 40, ALT 34, ALP 157, Tb 0.7  INR 0.99    01/15/2023  AST 40, ALT 36, ALP 162, Tb 0.8    01/17/2023  Lipase 74  Hepatitis B surface Ag negative  Hepatitis B e Ag negative  Hepatitis B eAb negative  HBV PCR negative   Hepatitis Delta IgM abnormal   HCV AB negative  AMA negative  TS 13%    01/19/2023  Na 138, creatinine 0.7    02/27/2023  PLT 197  AST 76, ALT 64, ALP 214, Tb 0.7  Hepatitis B core Ab negative  Hepatitis B surface Ab negative  HBV PCR negative    05/24/2023  AST 45, ALT 34, ALP 195, Tb 0.6    07/25/2023  PLT 190  AST 26, ALT 23, ALP 150, Tb 0.8  INR 1.1  A1c 5.7    12/17/2023  AST 36 ALT 26 ALP 159 Tb 0.6    Imaging notable for:   AB US  10/05/2021  Hyperechoic and heterogenous  echotexture of the liver. No intrahepatic lesions  Spleen is 9.7cm  Impression  Fatty infiltration of the liver     Fibroscan 10/04/2022  CAP 286, 22.8kPA    AB US  10/24/2022  Hyperechoic and heterogenous echotexture of the liver. No intrahepatic lesions   Spleen is normal at 9.7cm  Impression:   Fatty infiltration of the liver     CT ab pelvis 01/14/2023  Prominent appendix without inflammatory changes. Acute appendicitis not excluded.   Thickening of the cecum through hepatic flexure without associated inflammatory changes. May represent colitis.     AB US  01/15/2023  Liver demonstrates heterogenous attenuating echotexture spanning 15.1cm in greatest long axis dimensions. There is no intrahepatic biliary dilation. The portal vein is patent with normal directional flow. The common duct measures 3mm within normal limits. The gallbladder is unremarkable. Limited evaluation of the pancreas, IVC, aorta  No ascites.     CT ab pelvis 06/20/2023  1.  No definite CT findings to explain abdominal pain/weight loss.  2.  Mildly irregular hepatic surface contour, likely reflects chronic liver disease. No focal liver lesion. Small perisplenic collaterals, but otherwise no signs of portal hypertension.  3.  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 The [employer] of Gastroenterology Guidelines recommend contrast enhanced MRCP in one year to ensure stability.    MRI 12/03/2023  1. Mild hepatic steatosis with evidence of mild fibrosis. No focal liver mass or evidence of portal hypertension.     2. 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 The [employer] of Gastroenterology Guidelines recommend contrast enhanced MRCP in 1-2 years to ensure   stability.*     Procedure  EGD 01/17/2023           Fibroscan 01/15/2024  Interpretation:  This Liver Stiffness Score is suggestive of:  -  F2 Fibrosis  Interpretation: The CAP Score is suggestive of:   -  S2 Steatosis    Assessment & Plan:   [name] [name] is a 53 y.o. female with MASLD, who is referred for abnormal Hepatitis B labs.     # MASLD  # Abnormal liver tests  # Abnormal imaging of the liver   The patient has exhibited elevated liver tests with hepatocellular pattern over time. These are improving.     While there was an initial concern for Hepatitis B based on serologies 08/2022 and query of Hepatitis D, it appears that repeat labs by 12/2022 was not consistent with Hepatitis B, and as such making Hepatitis D unlikely. Unclear if she had a transient Hepatitis B exposure.     OSH Fibroscan 10/2022 notable for 22.8kPA for CAP286 raising concern for cirrhosis. Fibroscan 11/2023 with F2/S2    Etiology is likely related to MASLD. Other considerations include AMA negative PBC.    Recommend lifestyle modifications including avoidance of alcohol, diet and exercise with goal toward weight reduction of at least 10% of body weight to observe improvement in liver tests and any steatosis or fibrosis.   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 The time documented was exclusive of any time spent on any separately billed procedure.  Thank you for the opportunity to participate in the care of this patient. Please feel free to contact me with any questions or concerns.      Sharlyne Pay, MD  Transplant Hepatologist  [facility], [facility], [place] to participate in the care of this patient. Please feel free to contact me with any questions or concerns.      Sharlyne Pay, MD  Transplant Hepatologist  [facility], [facility], [place]

## 2024-03-26 MED ORDER — RESMETIROM 80 MG PO TABS
80 mg | ORAL_TABLET | Freq: Every day | ORAL | 3 refills | 30.00000 days | Status: AC
Start: 2024-03-26 — End: 2024-03-28

## 2024-03-27 ENCOUNTER — Ambulatory Visit: Payer: PRIVATE HEALTH INSURANCE

## 2024-03-27 ENCOUNTER — Telehealth: Payer: PRIVATE HEALTH INSURANCE

## 2024-03-27 NOTE — Telephone Encounter
 done

## 2024-03-27 NOTE — Telephone Encounter
 Hi Dr. Wendee,  Prior auth for Rezdiffra  is approved! When you have a moment, please reroute the rx to a [facility] pharmacy.     Thank you!

## 2024-03-28 MED ORDER — RESMETIROM 80 MG PO TABS
80 mg | ORAL_TABLET | Freq: Every day | ORAL | 3 refills | Status: AC
Start: 2024-03-28 — End: ?

## 2024-03-28 MED FILL — REZDIFFRA 80 MG PO TABS: 80 80 MG | ORAL | 30 days supply | Qty: 30 | Fill #0

## 2024-05-16 ENCOUNTER — Ambulatory Visit: Payer: PRIVATE HEALTH INSURANCE | Attending: Student in an Organized Health Care Education/Training Program

## 2024-06-22 NOTE — Progress Notes
 Outpatient Hepatology Follow up      PATIENT: [name] [name]  MRN: [id number]  DOB: April 18, 1971  DATE OF SERVICE: 06/23/2024  REFERRING PRACTITIONER: Wendee Lily, MD, MS  PRIMARY CARE PROVIDER: Rojter, Unice BRAVO, MD  REASON FOR REFERRAL: abnormal Hepatitis B labs  CHIEF COMPLAINT:  abnormal Hepatitis B labs    Subjective:      [name] [name] is a 53 y.o. 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     Additional co-morbidities: H. Pylori    The patient was previously seen by Dr. Unice Rojter. As detailed in note, '' In around 2020/2021, patient underwent abdominal ultrasound and was told had fatty liver. Work up as detailed below:     10/05/2021: ''AB US : fatty liver, no liver lesions, spleen 9.7cm''  02/12/2022:  AST 93, ALT 121, ALP 112  08/31/2022: PLT 215, AST 84, ALT 69, ALP 164, Tb 0.8, ceruloplasmin 37, Hepatitis B surface Ag positive, Hepatitis B core Ab positive, HCV Ab negative, A1AT MM  10/04/2022 Fibroscan 22.8kPA, CAP 386  10/24/2022 AB US : fatty liver, no hepatic lesions, spleen 9.7cm, no ascites   11/09/2022: AST 51, ALT 46, ALP 172, Tb 0.8, Hepatitis B e Ag negative, HBV PCR negative, HDV Ab positive   Met with Dr. Geraldean to complete HDV PCR labs but I do not have the results.   OSH hospitalization 4/15-4/20/2024 for left sided abdominal pain and left flank pain associated with watery diarrhea. Underwent CT ab pelvis which described prominent appendix and thickening of cecum. Also underwent an EGD but do not have the results. Notes mention gastritis and gastric polyp  Weight was 152lbs  on 01/04/2023 and now 145lbs.   Met with Dr. Nulsen 04/25/2023 for abdominal pain   Started Hepatitis B vaccination with PCP and is due for second dose in October 2024.   For arthritis and eyes was started on prednisone and plaquenil for Sjogren. Prednisone eventually tapered off    Today's appointment is supported with a Spanish ID [id number]. Weight is slightly up.     Reports some lower quadrant abdominal pain. Occasional loose stools and pain with BM. No emesis. Some heartburn    Gastroenterology Dr. Nulsen.     Meal recall:   Cooks at home    Exercise: walking 40 minutes daily     Alcohol: does not consume alcohol     Supplements: none    Family History  Brother: liver disease  Uncle: died from liver disease    Social History     Socioeconomic History    Marital status: Married   Tobacco Use    Smoking status: Never    Smokeless tobacco: Never   Substance and Sexual Activity    Alcohol use: Never    Drug use: Never     Social Drivers of Health     Physical Activity: At Risk (02/21/2024)    Received from [facility]    Physical Activity     Weekly Physical Activity: 2   Stress: Not at Risk (02/21/2024)    Received from [facility]    Stress     Do you feel these kinds of stress these days?: 1   Financial Resource Strain: Not at Risk (02/21/2024)    Received from [employer]     How hard is it for you to pay for the very basics like food, housing, heating, medical care, and medications?: 1     Outpatient Medications  Prior to Visit   Medication Sig    amLODIPine 5 mg tablet Take 1 tablet (5 mg total) by mouth.    aspirin 81 mg EC tablet Take 1 tablet (81 mg total) by mouth daily.    atorvastatin 20 mg tablet Take 1 tablet (20 mg total) by mouth.    hydroxychloroquine 200 mg tablet Take 1 tablet (200 mg total) by mouth daily.    losartan 100 mg tablet Take 1 tablet (100 mg total) by mouth.    LUMIGAN 0.01 % ophthalmic solution Place 1 drop into the right eye.    pilocarpine 5 mg tablet Take 1 tablet (5 mg total) by mouth three (3) times daily.    prednisoLONE acetate 1% ophthalmic suspension Place 1 drop into both eyes three (3) times daily.    RESTASIS 0.05 % ophthalmic emulsion Place 1 drop into both eyes two (2) times daily.    loratadine 10 mg tablet Take 1 tablet (10 mg total) by mouth. (Patient not taking: Reported on 06/23/2024)    polyethylene glycol powder Take 17 g by mouth daily. (Patient not taking: Reported on 06/23/2024)     No facility-administered medications prior to visit.     Allergies   Allergen Reactions    Lisinopril Other (See Comments)     Headache x 5 months with medication    Hydrochlorothiazide        Review of Systems:   14 point Complete ROS is negative except for above      Objective:      BP 146/83  ~ Pulse 71  ~ Temp 36.2 ?C (97.2 ?F) (Temporal)  ~ Ht 5' 1'' (1.549 m)  ~ Wt 154 lb 3.2 oz (69.9 kg)  ~ SpO2 100%  ~ BMI 29.14 kg/m?   Gen: NAD, AAO4  HEENT: no scleral icterus  AB: central fat deposition, nontender, nondistended, no hepatomegaly, no splenomegaly, no ascites  Ext: no peripheral edema  MSK: well nourished  SKIN: no jaundice    Lab Review:   02/12/2022  AST 93, ALT 121, ALP 112, Tb 0.8    08/31/2022  PLT 215  AST 84, ALT 69, ALP 164, Tb 0.8  Hepatitis A Ab reactive  Hepatitis B surface Ag reactive   Hepatitis B core AB reactive  HCV AB negative  TS 19%  SMA negative  Ceruloplasmin 37    11/09/2022  PLT 157  Creatinine 0.64, AST 51, ALT 46, ALP 172, Tb 0.8  Hepatitis B e Ag negative, Hepatitis B e AB nonreactive  HBV PCR negative   Hepatitis Delta Ab positive   HIV negative  AFP L3 10.3%    01/04/2023  Hepatitis B core Ab negative  AMA <1:20    01/14/2023  WBC 3.5, Hb 11.2, PLT 216  AST 40, ALT 34, ALP 157, Tb 0.7  INR 0.99    01/15/2023  AST 40, ALT 36, ALP 162, Tb 0.8    01/17/2023  Lipase 74  Hepatitis B surface Ag negative  Hepatitis B e Ag negative  Hepatitis B eAb negative  HBV PCR negative   Hepatitis Delta IgM abnormal   HCV AB negative  AMA negative  TS 13%    01/19/2023  Na 138, creatinine 0.7    02/27/2023  PLT 197  AST 76, ALT 64, ALP 214, Tb 0.7  Hepatitis B core Ab negative  Hepatitis B surface Ab negative  HBV PCR negative    05/24/2023  AST 45, ALT 34, ALP 195, Tb 0.6  07/25/2023  PLT 190  AST 26, ALT 23, ALP 150, Tb 0.8  INR 1.1  A1c 5.7    12/17/2023  AST 36 ALT 26 ALP 159 Tb 0.6    03/24/2024  AST 40 ALT 39 ALP 160 Tb 0.7    Imaging notable for:   AB US  10/05/2021  Hyperechoic and heterogenous echotexture of the liver. No intrahepatic lesions  Spleen is 9.7cm  Impression  Fatty infiltration of the liver     Fibroscan 10/04/2022  CAP 286, 22.8kPA    AB US  10/24/2022  Hyperechoic and heterogenous echotexture of the liver. No intrahepatic lesions   Spleen is normal at 9.7cm  Impression:   Fatty infiltration of the liver     CT ab pelvis 01/14/2023  Prominent appendix without inflammatory changes. Acute appendicitis not excluded.   Thickening of the cecum through hepatic flexure without associated inflammatory changes. May represent colitis.     AB US  01/15/2023  Liver demonstrates heterogenous attenuating echotexture spanning 15.1cm in greatest long axis dimensions. There is no intrahepatic biliary dilation. The portal vein is patent with normal directional flow. The common duct measures 3mm within normal limits. The gallbladder is unremarkable. Limited evaluation of the pancreas, IVC, aorta  No ascites.     CT ab pelvis 06/20/2023  1.  No definite CT findings to explain abdominal pain/weight loss.  2.  Mildly irregular hepatic surface contour, likely reflects chronic liver disease. No focal liver lesion. Small perisplenic collaterals, but otherwise no signs of portal hypertension.  3.  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 The [employer] of Gastroenterology Guidelines recommend contrast enhanced MRCP in one year to ensure stability.    MRI 12/03/2023  1. Mild hepatic steatosis with evidence of mild fibrosis. No focal liver mass or evidence of portal hypertension.     2. 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 The [employer] of Gastroenterology Guidelines recommend contrast enhanced MRCP in 1-2 years to ensure   stability.*     Procedure  EGD 01/17/2023           Fibroscan 01/15/2024  Interpretation:  This Liver Stiffness Score is suggestive of:  -  F2 Fibrosis  Interpretation: The CAP Score is suggestive of:   -  S2 Steatosis    Assessment & Plan:   [name] [name] is a 53 y.o. 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     # MASLD  # Abnormal liver tests  # Abnormal imaging of the liver   The patient has exhibited elevated liver tests with hepatocellular pattern over time. These are improving.     While there was an initial concern for Hepatitis B based on serologies 08/2022 and query of Hepatitis D, it appears that repeat labs by 12/2022 was not consistent with Hepatitis B, and as such making Hepatitis D unlikely. Unclear if she had a transient Hepatitis B exposure.     OSH Fibroscan 10/2022 notable for 22.8kPA for CAP286 raising concern for cirrhosis. Fibroscan 11/2023 with F2/S2    Etiology is likely related to MASLD. Other considerations include AMA negative PBC.    Recommend lifestyle modifications including avoidance of alcohol, diet and exercise with goal toward weight reduction of at least 10% of body weight to observe improvement in liver tests and any steatosis or fibrosis.   Continue to  monitor liver chemistry tests over time  Maintain HCC screening until fibrosis state is clarified and consistent  Planned to initiate Resmetirom  80mg  for stage 2 fibrosis but never got it   Discussed options including semaglutide versus Resmetirom .  We compared options and reviewed risks/benefits at length of each medication. Patient prefers to avoid needles if possible. Can try to apply again for Resmetirom   Plan for MRI elastography to clarify fibrosis state and evaluate abdominal pain.   Referral to Clinical Nutrition or through PCP  Avoid alcohol   Avoid herbal supplement    # Pancreas cyst  Small and noted on CT ab pelvis  Monitor with MRI annually     Follow up: 3 months     37 minutes were spent personally by me today on this encounter which include today's pre-visit review of the chart, obtaining appropriate history, performing an evaluation, documentation and discussion of management with details supported within the note for today's visit. The time documented was exclusive of any time spent on any separately billed procedure.  Thank you for the opportunity to participate in the care of this patient. Please feel free to contact me with any questions or concerns.      Sharlyne Pay, MD  Transplant Hepatologist  [facility], [facility], [place]

## 2024-06-23 ENCOUNTER — Ambulatory Visit: Payer: PRIVATE HEALTH INSURANCE | Attending: Student in an Organized Health Care Education/Training Program

## 2024-06-23 MED ORDER — RESMETIROM 80 MG PO TABS
80 mg | ORAL_TABLET | Freq: Every day | ORAL | 3 refills | Status: AC
Start: 2024-06-23 — End: ?

## 2024-06-23 MED ORDER — OMEPRAZOLE 20 MG PO CPDR
20 mg | ORAL_CAPSULE | Freq: Every day | ORAL | 3 refills | 30.00000 days | Status: AC
Start: 2024-06-23 — End: ?

## 2024-06-23 NOTE — Patient Instructions
 It was a pleasure meeting with you today.   Labs every 3 months  Will try to apply for Resmetirom  again. Consider options including ozempic  Schedule MRI elastography through radiology [phone]  Referral for Clinical Nutrition, call [phone]  Avoid alcohol  Avoid herbal supplements  Tylenol can be used for pain or fevers but should not exceed 2000mg  per day  Avoid ibuprofen, advil, aleve, excedrin, motrin as this can increase the risk of stomach ulcers and gastrointestinal bleeding  Please note that we will make our best effort to contact you by phone regarding urgent concerns and critical diagnostic testing  [facility] messaging is intended for the use of basic and simple correspondence pertaining to non urgent matters.   If you have complex concerns or multiple questions, please set up an appointment to review  If you have an inquiry, please provide our staff with a detailed message regarding your question and best way to contact you.     If you choose to obtain diagnostic testing outside of [facility], it is your responsibility to communicate with the office about forwarding your results.    If your treatment plans includes imaging including a CT ab pelvis or MRI, please confirm any requirements including necessary blood work with radiology at the time of scheduling and notify your provider if updated labs will be needed.   Please reach out to your primary care provider for matters that are unrelated to liver disease

## 2024-06-24 ENCOUNTER — Telehealth: Payer: PRIVATE HEALTH INSURANCE

## 2024-06-24 NOTE — Telephone Encounter
-----   Message from Morene PHEBE Skinner, MD sent at 06/24/2024  7:58 AM PDT -----  Can we book for a fup please? Thanks   [name] [name]  ----- Message -----  From: Wendee Lily, MD, MS  Sent: 06/23/2024  11:19 AM PDT  To: Morene PHEBE Skinner, MD    Hi,     Would you mind following up with patient for chronic abdominal pain?     Thanks

## 2024-06-24 NOTE — Telephone Encounter
Patient has been scheduled for f/u

## 2024-07-09 ENCOUNTER — Ambulatory Visit: Payer: PRIVATE HEALTH INSURANCE

## 2024-07-09 ENCOUNTER — Telehealth: Payer: PRIVATE HEALTH INSURANCE

## 2024-07-09 MED ORDER — RESMETIROM 80 MG PO TABS
60 mg | ORAL_TABLET | Freq: Every day | ORAL | 3 refills | Status: AC
Start: 2024-07-09 — End: 2024-07-10

## 2024-07-09 MED ORDER — RESMETIROM 80 MG PO TABS
60 mg | ORAL_TABLET | Freq: Every day | ORAL | 3 refills | Status: AC
Start: 2024-07-09 — End: ?

## 2024-07-09 NOTE — Telephone Encounter
 Message to Practice/Provider      Message:   [facility] contacted our office regarding a potential drug interaction involving Resmetirom . The patient is currently also taking Cyclosporine and Atorvastatin. Please review for any contraindications or necessary adjustments.    [facility]      Return call is not being requested by the patient or caller.    Patient or caller has been notified that their message will be reviewed within 1-2 business days.

## 2024-07-09 NOTE — Telephone Encounter
 I can try to lower the dosage to 60mg 

## 2024-07-09 NOTE — Telephone Encounter
 ok

## 2024-07-13 ENCOUNTER — Telehealth: Payer: PRIVATE HEALTH INSURANCE

## 2024-07-13 MED ORDER — REZDIFFRA 60 MG PO TABS
60 mg | ORAL_TABLET | Freq: Every day | ORAL | 3 refills | 30.00000 days | Status: AC
Start: 2024-07-13 — End: ?

## 2024-07-13 NOTE — Telephone Encounter
 I believe we lowered this because she was on statin.

## 2024-07-13 NOTE — Telephone Encounter
 Medication Verification      Medication: Resmetirom  80 MG TABS [[id number]]    Caller would like to verify the following (Please check all that applies):    [x]  Dosage    []  Quantity      [x]  Directions     Additional information? Yadelis from [facility] need to confirm the dosage, prescription is sent for 80 mg but directions state take 60 mg by mouth daily. Thank you.      CBN#: [phone]  Patient or caller has been notified of the turnaround time of 1-2 business day(s).

## 2024-07-21 ENCOUNTER — Telehealth: Payer: PRIVATE HEALTH INSURANCE

## 2024-07-21 NOTE — Telephone Encounter
 Medication Verification      Medication:  rezdiffra  60mg      Caller would like to verify the following (Please check all that applies):    [x]  Dosage    []  Quantity      []  Directions     Additional information?  Patient was receiving 80mg  and now changed to 60mg      Pharmacy # [phone]     Patient or caller has been notified of the turnaround time of 1-2 business day(s).

## 2024-07-22 NOTE — Telephone Encounter
 Spoke to [employer] pharmacist at [facility] for prescription clarification. Rezdiffra  change from 80 mg to 60 mg       Rezdiffra  PA has been Approved.  PA End Date: 07/15/2025

## 2024-08-19 ENCOUNTER — Telehealth: Payer: PRIVATE HEALTH INSURANCE

## 2024-08-19 ENCOUNTER — Inpatient Hospital Stay: Payer: PRIVATE HEALTH INSURANCE | Attending: Gastroenterology

## 2024-08-19 ENCOUNTER — Ambulatory Visit: Payer: PRIVATE HEALTH INSURANCE | Attending: Gastroenterology

## 2024-08-19 DIAGNOSIS — R197 Diarrhea, unspecified: Principal | ICD-10-CM

## 2024-08-19 DIAGNOSIS — R109 Unspecified abdominal pain: Secondary | ICD-10-CM

## 2024-08-19 NOTE — Telephone Encounter
 Orders Request    What is being requested? (Tests, Labs, Imaging, etc.):   Stool tests  Reason for the request: The rep.stated the pt was not able to provide a stool sample and the order was canceled today.Pls place new orders for testing.      Where does the patient want to be seen?  [place]    If outside [facility], what is the fax number to the facility?      Has the patient seen their doctor for this matter? yes    Last office visit: 08-19-24    Patient or caller was offered an appointment but declined.no    Patient or caller has been notified of the turnaround time of 1-2 business day(s). yes

## 2024-08-19 NOTE — Progress Notes
 OUTPATIENT GASTROENTEROLOGY FOLLOW UP    Date of service: 08/19/2024  Reason for consult: LUQ pain   Referring Provider: Shannon Morene FALCON., MD  PCP: Rojter, Unice BRAVO, MD    HISTORY OF PRESENT ILLNESS:   53 y.o. female who presents for LUQ pain. History of scleroderma, overlap RA/sjogrens/limited sclerosis    Chronic LUQ which became more severe at the beginning of this year.   Presented to outside hospital in April with pain and diarrhea. CT showed cecal wall thickening. Treated with ceftriaxone and flagyl. Reportedly also treated for pancreatitis although CT did not show pancreatitis.   Outside EGD showed H pylori gastritis. She reports that it was treated with antibiotics.   She reports constipation. Tries to manage with fruits. BM every day. However +straining and discomfort.   Colonoscopy in June was unremarkable.     Interval 10/15/23:  Accompanied by daughter Shasta   HP breath test negative 05/2023  Continues to have LUQ pain; it is about 5/10 in severity   Better with food   She is taking omeprazole  20mg  daily   Reports dysphagia - as if food is getting stuck in throat - has to clear throat   She has sicca and is using pilocarpine 5mg  TID   +Dysphonia     Interval 01/14/24:  Denies abdominal pain   Continues to have occasional dysphagia, needs to follow food with water   Esophagram was unremarkable       PAST MEDICAL HISTORY:   No past medical history on file.    PAST SURGICAL HISTORY:   No past surgical history on file.    ALLERGIES:    Lisinopril and Hydrochlorothiazide    HOME MEDICATIONS:     Outpatient Medications Prior to Visit   Medication Sig    amLODIPine 5 mg tablet Take 1 tablet (5 mg total) by mouth.    aspirin 81 mg EC tablet Take 1 tablet (81 mg total) by mouth daily.    atorvastatin 20 mg tablet Take 1 tablet (20 mg total) by mouth.    hydroxychloroquine 200 mg tablet Take 1 tablet (200 mg total) by mouth daily.    losartan 100 mg tablet Take 1 tablet (100 mg total) by mouth.    LUMIGAN 0.01 % ophthalmic solution Place 1 drop into the right eye.    pantoprazole 40 mg DR tablet Take 1 tablet (40 mg total) by mouth daily.    pilocarpine 5 mg tablet Take 1 tablet (5 mg total) by mouth three (3) times daily.    prednisoLONE acetate 1% ophthalmic suspension Place 1 drop into both eyes three (3) times daily.    Resmetirom  (REZDIFFRA ) 60 MG TABS Take 60 mg by mouth daily.    RESTASIS 0.05 % ophthalmic emulsion Place 1 drop into both eyes two (2) times daily.    loratadine 10 mg tablet Take 1 tablet (10 mg total) by mouth. (Patient not taking: Reported on 08/19/2024)    polyethylene glycol powder Take 17 g by mouth daily. (Patient not taking: Reported on 08/19/2024)     No facility-administered medications prior to visit.        FAMILY HISTORY:   No family history on file.    SOCIAL HISTORY:     Social History     Tobacco Use    Smoking status: Never    Smokeless tobacco: Never   Substance Use Topics    Alcohol use: Never    Drug use: Never  REVIEW OF SYSTEMS:   A 14- point review of systems was negative except where noted in the HPI.    PHYSICAL EXAM:   Ht 5' 1'' (1.549 m)  ~ Wt 151 lb (68.5 kg)  ~ BMI 28.53 kg/m?     Gen: No acute distress, answers questions appropriately.  Eyes: Anicteric sclera, no conjunctival pallor  Neck: Trachea midline, good range of motion  Resp: normal WOB   MSK: No peripheral edema, clubbing, or cyanosis   Neuro: Alert and oriented, grossly nonfocal, moving all extremities   Skin: Warm and well perfused, no rashes  Psych: Normal mood and affect    LABORATORY DATA:     Lab Results   Component Value Date    WBC 4.85 12/17/2023    HGB 12.5 12/17/2023    HCT 40.6 12/17/2023    PLT 192 12/17/2023     Lab Results   Component Value Date    AST 38 06/23/2024    AST 40 03/24/2024    AST 36 12/17/2023    ALT 28 06/23/2024    ALT 39 03/24/2024    ALT 26 12/17/2023    BILITOT 0.7 06/23/2024    BILITOT 0.7 03/24/2024    BILITOT 0.6 12/17/2023    ALKPHOS 146 (H) 06/23/2024    ALKPHOS 160 (H) 03/24/2024    ALKPHOS 159 (H) 12/17/2023    INR 1.0 12/17/2023    ALBUMIN 4.4 06/23/2024    AFP 3 12/17/2023    AFP 2.5 12/17/2023    AFP 3 05/24/2023      Lab Results   Component Value Date    CREAT 0.78 06/23/2024    BUN 12 06/23/2024    NA 139 06/23/2024    K 4.3 06/23/2024    CL 107 (H) 06/23/2024    CO2 23 06/23/2024    CALCIUM 9.5 06/23/2024     No results found for: ''HBVQPCR''  No results found for: ''HCVQPCR''    RADIOLOGY:     Esophagram 01/10/24:      FINDINGS:     Esophageal caliber: Normal with no focal narrowing.     Intraluminal filling defects: None.     Esophageal motility: Normal.     Hiatal hernia: None.     Gastroesophageal reflux: None observed.        IMPRESSION:       Normal esophagram.      CT ab pelvis 01/14/2023:          ENDOSCOPY:     Colonoscopy 03/08/23:    FINDINGS:   Normal TI. Diminutive transverse colon polyp removed with cold forceps polypectomy. Sigmoid diverticulosis. The colonoscopy examination was otherwise normal.        COLON, TRANSVERSE, POLYP (BIOPSY):   - Colonic mucosa with prominent benign appearing lymphoid aggregate  - No dysplasia (multiple deeper levels examined)            ASSESSMENT/RECOMMENDATIONS:     Problem List Items Addressed This Visit    None      LUQ/flank pain. History of H pylori, eradication confirmed on UBT. Feeling better on omeprazole  40mg  daily.   Gastric IM. EGD with mapping.   Dysphagia and dysphonia. Suspect secondary to sicca (responding to pilocarpine and appears to be at max dose). Esophagram normal. Discussed further testing with HREM and she would like to proceed. She understands that this is unlikely to change management if we find ineffective motility as would be expected with scleroderma.     Recommend:  EGD with gastric mapping and HREM     No orders of the defined types were placed in this encounter.      Morene Skinner, MD    Digestive Diseases - [facility]     Problem:   []  New minor/self-limited problem  []  Acute uncomplicated illness/injury   []   Acute systemic illness or complicated injury   []   New problem with uncertain diagnosis  []  New problem that poses threat to life or bodily function  []   2 chronic stable problems   [x]   1 chronic unstable problem     Review of Data:   I have   []  Reviewed/ordered []  1 []  2 []  >= 3 unique laboratory, radiology, and/or diagnostic tests noted above   []  Reviewed []  1 []  2 []  >= 3 prior external notes and incorporated into patient assessment   []  Discussed management or test interpretation with external provider(s) as noted     Risk of Complication:   This clinical research associate has deemed the above diagnoses to have a risk of complication, morbidity or mortality of:   []  Minimal;    []  Low;     [x]  Moderate;     []  Severe

## 2024-08-19 NOTE — Telephone Encounter
 MPU PROCEDURE SCHEDULING REQUEST      Please schedule the patient for the following procedure:   UPPER ENDOSCOPY    Please schedule for the following provider: Nulsen   Date and time:  12/08 at 2:30 pm   LOCATION: [place] [facility]     Special Instructions-    []  Patient would like a sooner procedure slot if one becomes available. [facility] please add the patient to a procedure wait list. GI Scheduler to contact patient with sooner availability if openings become available.     Staff, please check off all the options before routing the encounter:       [x]  Patient has been informed about the procedure date and time.     [x]  Patient has been provided check in instructions, to bring responsible adult to procedure.    [x]  Provided Prep Instructions, based on orders.     [x]  Reviewed if RX prep was prescribed if applicable to [facility].      [x]  Ensured insurance turnaround STAT, or Routine were flagged accordingly.     []  [facility] please Route an additional message to FCU for any patients scheduled within 7 days for expedited financial clearance [urgent or scheduled within 7 days] Pool to message is:    [facility] and ASSURANT Pool: [id number] [id number]   [facility] Pool: [id number] GI LIONELL BARROWS [id number]   [facility] & MPU not cleared by FCU. Lucia Orozco and Ivon Ayala :GI Schedulers      [x]  Patient informed if MAC not covered, it is a $200 out of pocket expense. Patient will be notified 5-10 days prior to procedure if due.     PAO if the slot is booked prior to scheduling request being fulfilled, please reach out to the patient to coordinate new procedure date and time.       Reminder to all Clinic Staff?If you are coordinating specific procedure time and date with patient and MD, it is your responsibility to confirm these details with the patient. If MPU schedulers are communicating directly with the patient it is their responsibility to confirm the appointment.        MAC SCRIPT  As you may be aware, there are two sedation options that are used for colonoscopy and upper gastrointestinal endoscopy. One form of sedation consists of a combination of medicines (Versed and Fentanyl) administered by a registered nurse (RN) under the supervision of your gastroenterologist.  This form of sedation is included in your gastroenterologist?s fee.   The other form of sedation is ?intravenous sedation? with the use of Propofol  (sedative) administered by a specially trained physician called an anesthesiologist, also known as MAC (Monitored Anesthesia Care). Propofol  has a rapid onset and short duration, compared to Versed and Fentanyl, and provides complete sedation, whereas with conscious sedation this is not guaranteed. Propofol  also has potent anti-nausea properties, so one tends to awake quickly and without any ill effects.               Effective July 1st, 2017 the [facility] Medical Procedure unit in [facility] and [place] will only perform cases with Monitored Anesthesia Care (Propofol ) for endoscopy procedures.   This type of sedation is not always covered by health plans. We will submit an authorization request if necessary for this type of sedation, but approval will be based on the medical guideline for that insurance carrier, and  there are limited medical conditions that would be considered appropriate to cover an anesthesiologist which  include ?severe systematic diseases? such as significant heart and/or lung diseases, sever insulin dependent diabetes, sleep apnea, and morbid obesity. Other conditions that may require an anesthesiologist include a history of drug or alcohol abuse, chronic use of Valium type medications such as Ativan, Xanax, or Clonopin, or a prior documented history of previous problems with conscious sedations (Versed and Fentanyl) or anesthesia.  Please refer to your health plan medical guideline for specifics on their anesthesia coverage determination.

## 2024-08-19 NOTE — Progress Notes
 OUTPATIENT GASTROENTEROLOGY FOLLOW UP    Date of service: 08/19/2024  Reason for consult: LUQ pain   Referring Provider: Shannon Morene FALCON., MD  PCP: Rojter, Unice BRAVO, MD    HISTORY OF PRESENT ILLNESS:   53 y.o. female who presents for LUQ pain. History of scleroderma, overlap RA/sjogrens/limited sclerosis    Chronic LUQ which became more severe at the beginning of this year.   Presented to outside hospital in April with pain and diarrhea. CT showed cecal wall thickening. Treated with ceftriaxone and flagyl. Reportedly also treated for pancreatitis although CT did not show pancreatitis.   Outside EGD showed H pylori gastritis. She reports that it was treated with antibiotics.   She reports constipation. Tries to manage with fruits. BM every day. However +straining and discomfort.   Colonoscopy in June was unremarkable.     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    PAST SURGICAL HISTORY:   No past surgical history on file.    ALLERGIES:    Lisinopril and Hydrochlorothiazide    HOME MEDICATIONS:     Outpatient Medications Prior to Visit   Medication Sig    amLODIPine 5 mg tablet Take 1 tablet (5 mg total) by mouth.    aspirin 81 mg EC tablet Take 1 tablet (81 mg total) by mouth daily.    atorvastatin 20 mg tablet Take 1 tablet (20 mg total) by mouth.    hydroxychloroquine 200 mg tablet Take 1 tablet (200 mg total) by mouth daily.    losartan 100 mg tablet Take 1 tablet (100 mg total) by mouth.    LUMIGAN 0.01 % ophthalmic solution Place 1 drop into the right eye.    pantoprazole 40 mg DR tablet Take 1 tablet (40 mg total) by mouth daily.    pilocarpine 5 mg tablet Take 1 tablet (5 mg total) by mouth three (3) times daily.    prednisoLONE acetate 1% ophthalmic suspension Place 1 drop into both eyes three (3) times daily.    Resmetirom  (REZDIFFRA ) 60 MG TABS Take 60 mg by mouth daily.    RESTASIS 0.05 % ophthalmic emulsion Place 1 drop into both eyes two (2) times daily.    loratadine 10 mg tablet Take 1 tablet (10 mg total) by mouth. (Patient not taking: Reported on 08/19/2024)    polyethylene glycol powder Take 17 g by mouth daily. (Patient not taking: Reported on 08/19/2024)     No  facility-administered medications prior to visit.        FAMILY HISTORY:   No family history on file.    SOCIAL HISTORY:     Social History     Tobacco Use    Smoking status: Never    Smokeless tobacco: Never   Substance Use Topics    Alcohol use: Never    Drug use: Never       REVIEW OF SYSTEMS:   A 14- point review of systems was negative except where noted in the HPI.    PHYSICAL EXAM:   Ht 5' 1'' (1.549 m)  ~ Wt 151 lb (68.5 kg)  ~ BMI 28.53 kg/m?     Gen: No acute distress, answers questions appropriately.  Eyes: Anicteric sclera, no conjunctival pallor  Neck: Trachea midline, good range of motion  Resp: normal WOB   MSK: No peripheral edema, clubbing, or cyanosis   Neuro: Alert and oriented, grossly nonfocal, moving all extremities   Skin: Warm and well perfused, no rashes  Psych: Normal mood and affect    LABORATORY DATA:     Lab Results   Component Value Date    WBC 4.85 12/17/2023    HGB 12.5 12/17/2023    HCT 40.6 12/17/2023    PLT 192 12/17/2023     Lab Results   Component Value Date    AST 38 06/23/2024    AST 40 03/24/2024    AST 36 12/17/2023    ALT 28 06/23/2024    ALT 39 03/24/2024    ALT 26 12/17/2023    BILITOT 0.7 06/23/2024    BILITOT 0.7 03/24/2024    BILITOT 0.6 12/17/2023    ALKPHOS 146 (H) 06/23/2024    ALKPHOS 160 (H) 03/24/2024    ALKPHOS 159 (H) 12/17/2023    INR 1.0 12/17/2023    ALBUMIN 4.4 06/23/2024    AFP 3 12/17/2023    AFP 2.5 12/17/2023    AFP 3 05/24/2023      Lab Results   Component Value Date    CREAT 0.78 06/23/2024    BUN 12 06/23/2024    NA 139 06/23/2024    K 4.3 06/23/2024    CL 107 (H) 06/23/2024    CO2 23 06/23/2024    CALCIUM 9.5 06/23/2024     No results found for: ''HBVQPCR''  No results found for: ''HCVQPCR''    RADIOLOGY:     Esophagram 01/10/24:      FINDINGS:     Esophageal caliber: Normal with no focal narrowing.     Intraluminal filling defects: None.     Esophageal motility: Normal.     Hiatal hernia: None.     Gastroesophageal reflux: None observed.        IMPRESSION:       Normal esophagram.      CT ab pelvis 01/14/2023:          ENDOSCOPY:     Colonoscopy 03/08/23:    FINDINGS:   Normal TI. Diminutive transverse colon polyp removed with cold forceps polypectomy. Sigmoid diverticulosis. The colonoscopy examination was otherwise normal.        COLON, TRANSVERSE, POLYP (BIOPSY):   - Colonic mucosa with prominent benign appearing lymphoid aggregate  - No dysplasia (multiple deeper levels examined)            ASSESSMENT/RECOMMENDATIONS:     Problem List Items Addressed This Visit    None      LUQ/flank pain. History of H pylori, eradication confirmed on  UBT. Recommended EGD earlier this year which was not completed. This was more for dysphagia than dyspepsia at the time. Recent ER visit.   Gastric IM. EGD with mapping.   Dysphagia and dysphonia. Suspect secondary to sicca (responding to pilocarpine and appears to be at max dose). Esophagram normal. Did not complete HREM as previously recommended.   Diarrhea. Possibly medication related. She thinks onset was prior to starting Rezdiffra  which causes diarrhea in >25% of patients. Will send stool studies.     Recommend:   Orders Placed This Encounter    Bact Enteric Pathogen Panel PCR, Stool    Parasite Enteric Pathogen Panel    Norovirus PCR    Calprotectin, Stool    GI Endoscopy Procedures       Morene Skinner, MD    [facility]     Problem:   []  New minor/self-limited problem  []  Acute uncomplicated illness/injury   []   Acute systemic illness or complicated injury   [x]   New problem with uncertain diagnosis  []  New problem that poses threat to life or bodily function  []   2 chronic stable problems   [x]   1 chronic unstable problem     Review of Data:   I have   [x]  Reviewed/ordered []  1 []  2 [x]  >= 3 unique laboratory, radiology, and/or diagnostic tests noted above   []  Reviewed []  1 []  2 []  >= 3 prior external notes and incorporated into patient assessment   []  Discussed management or test interpretation with external provider(s) as noted     Risk of Complication:   This clinical research associate has deemed the above diagnoses to have a risk of complication, morbidity or mortality of:   []  Minimal;    []  Low;     [x]  Moderate;     []  Severe

## 2024-08-20 ENCOUNTER — Other Ambulatory Visit: Payer: PRIVATE HEALTH INSURANCE

## 2024-08-20 DIAGNOSIS — R197 Diarrhea, unspecified: Principal | ICD-10-CM

## 2024-08-21 ENCOUNTER — Other Ambulatory Visit: Payer: PRIVATE HEALTH INSURANCE

## 2024-08-24 ENCOUNTER — Telehealth: Payer: PRIVATE HEALTH INSURANCE

## 2024-08-24 ENCOUNTER — Other Ambulatory Visit: Payer: PRIVATE HEALTH INSURANCE

## 2024-08-24 NOTE — Telephone Encounter
 PDL Call to Clinic    Reason for Call:Patient's son Tita stated patient completed a lab but only filled 3 out of 4 containers and was planning to complete another container today however received results from the lab and requesting to confirm if another specimen needs to be collected or not. Tita requesting to confirm now due to long drive to lab; PDL no pick up, warm transferred to [facility] from PS    Appointment Related?  []  Yes  [x]  No     If yes;  Date:  Time:    Call warm transferred to PDL: []  Yes  [x]  No    Call Received by Clinic Representative:     If call not answered/not accepted, call received by Patient Services Representative: Corean

## 2024-08-24 NOTE — Telephone Encounter
 PDL resolved by PS team  PS Team resolved PDL    []  PS team warm transferred call to PDL    Call received by clinic Representative:     []  Clinic staff provided PS team with info to relay to patient-request resolved    []   PS team assisted patient with request    Patient request:      See encounter below. [name] [name] advised that we will get confirmation from MD if another sample needs to be collected or not.

## 2024-08-25 ENCOUNTER — Other Ambulatory Visit: Payer: PRIVATE HEALTH INSURANCE

## 2024-09-04 NOTE — Nursing Clinical Note
 Interpreter used. Patient requesting information on email

## 2024-09-07 MED ADMIN — PROPOFOL 200 MG/20ML IV EMUL (ANES): INTRAVENOUS | @ 23:00:00 | Stop: 2024-09-07

## 2024-09-07 MED ADMIN — PROPOFOL 200 MG/20ML IV EMUL (ANES): INTRAVENOUS | @ 22:00:00 | Stop: 2024-09-07

## 2024-09-07 MED ADMIN — LIDOCAINE HCL (CARDIAC) 100 MG/5ML IV SOSY: INTRAVENOUS | @ 22:00:00 | Stop: 2024-09-07 | NDC 76329339001

## 2024-09-07 MED ADMIN — SODIUM CHLORIDE 0.9% IV SOLN (500 ML): 5 mL/h | INTRAVENOUS | @ 22:00:00 | Stop: 2024-09-08 | NDC 00264780010

## 2024-09-07 MED ADMIN — PROPOFOL 200 MG/20ML IV EMUL: INTRAVENOUS | @ 22:00:00 | Stop: 2024-09-07 | NDC 63323026929

## 2024-09-07 MED ADMIN — PROPOFOL 200 MG/20ML IV EMUL: INTRAVENOUS | @ 23:00:00 | Stop: 2024-09-07 | NDC 63323026929

## 2024-09-07 NOTE — Discharge Instructions
 DISCHARGE INSTRUCTIONS    PROCEDURE: [x]  Upper endoscopy (EGD)   []  Colonoscopy    []  Sigmoidoscopy    FINDINGS:   Mild inflammation in stomach, samples taken   No dangerous findings     Due to the medications you received during your procedure, you may experience lightheadedness, dizziness, or sleepiness.  The effects of the medication you received can last until the following day.    If there is discomfort at your I.V. Site, apply a warm moist towel on the site until the soreness subsides.  If the discomfort persists for more than 48 hours, contact your physician.    You may experience a ''bloated'' feeling due to the air that was introduced into your body during the procedure.  You may feel more comfortable once you pass the air.  You may try drinking warm liquids or walking to help you pass the air.    If you had an upper endoscopy, a mild sore throat is not uncommon after the procedure.  You may try gargling or drinking warm liquids for relief. Over the counter sore throat lozenges may also be helpful, follow the package instructions.     ACTIVITY:    Avoid any strenuous physical activity for 24 hours  No driving or operating heavy machinery for 24 hours  No alcoholic beverages for 24 hours (may interact with some medications you received during your procedure)  Do not sign any legal documents after you leave or make any important personal or business decisions until the following day     INSTRUCTIONS:    [x]  Resume your usual diet    []  Specific diet instructions:     [x]  Resume your medications    []  Specific medication instructions:    []  No aspirin or NSAIDS (non-steroidal anti-inflammatory drugs) such as advil, motrin, ibuprofen, aleve, naprosyn, mobic, etc. for at least 10 days        Call [phone] and notify Dr. Nulsen if you experience any of the following:    Severe chest pain, difficulty breathing  Passage of blood clots, black tarry stools, vomiting blood  Severe inflammation at the I.V. site  Difficulty swallowing or persistent vomiting  Abdominal pain  Chills or fever greater than 101 F or 38.4 C within 24 hours of procedure    FOLLOW-UP CARE:    Contact Dr. Nulsen if you do not receive biopsy results (in the ''Test Results'' section of [facility]) within 14 days after the procedure.      INSTRUCCIONES DE ALTA    PROCEDIMIENTO: [x]  Endoscopia digestiva alta (EGD)   [ ]  Colonoscopia    [ ]  Sigmoidoscopia    HALLAZGOS:  Inflamaci?n leve en el est?mago, se tomaron muestras  No se encontraron hallazgos peligrosos    Debido a los medicamentos que recibi? durante el procedimiento, puede experimentar [employer], aturdimiento o somnolencia. Los efectos de la medicaci?n pueden durar hasta el d?a siguiente.    Si siente molestias en el lugar de la v?a intravenosa, aplique una toalla tibia y h?meda en la zona hasta que disminuya el dolor. Si la molestia persiste durante m?s de 48 horas, comun?quese con su m?dico.    Puede sentir hinchaz?n debido al aire que se introdujo en su cuerpo durante el procedimiento. Se sentir? mejor una vez que expulse el aire. Puede intentar beber l?quidos tibios o caminar para ayudar a expulsar el aire.    Si se le realiz? una endoscopia digestiva alta, es com?n tener un leve dolor de garganta  despu?s del procedimiento. Puede intentar hacer g?rgaras o beber l?quidos tibios para aliviarlo. Los caramelos para la garganta de venta libre tambi?n pueden ser ?tiles; siga las instrucciones del envase.    ACTIVIDAD:    Evite cualquier actividad f?sica extenuante durante 24 horas  No conduzca ni opere maquinaria pesada durante 24 horas  No consuma bebidas alcoh?licas durante 24 horas (pueden interactuar con algunos medicamentos que recibi? durante el procedimiento)  No firme ning?n documento legal despu?s de salir ni tome decisiones personales o comerciales importantes hasta el d?a siguiente    INSTRUCCIONES:  [x]  Reanude su dieta habitual  [ ]  Instrucciones diet?ticas espec?ficas:  [x]  Reanude sus medicamentos  [ ]  Instrucciones espec?ficas sobre medicamentos:  [ ]  No tome aspirina ni AINE (medicamentos antiinflamatorios no esteroideos) como Advil, Motrin, ibuprofeno, Aleve, Naprosyn, Mobic, etc., durante al menos 10 d?as    Llame al [phone] e informe al Dr. Nulsen si experimenta alguno de los siguientes s?ntomas:    Dolor tor?cico intenso, dificultad para respirar  Expulsi?n de co?gulos de sangre, heces negras y alquitranadas, v?mitos con sangre  Inflamaci?n grave en el lugar de la v?a intravenosa Sitio  Dificultad para tragar o v?mitos persistentes  Dolor abdominal  Escalofr?os o fiebre superior a 38.4 ?C (101 ?F) dentro de las 24 horas posteriores al procedimiento    SEGUIMIENTO:  Comun?quese con el Dr. Shannon si no recibe los resultados de la biopsia (en la secci?n ''Resultados de pruebas'' de [employer]) dentro de los 14 d?as posteriores al procedimiento.    IV SEDATION DISCHARGE INSTRUCTIONS ADULT    DIET: Your diet is based on your preference  Begin with clear liquids such as soda, apple juice, tea, etc.  DO NOT use a straw for drinking as  this increases the amount of air swallowed and can increase bloating and stomach distress.    If liquids are tolerated, progress slowly to your usual diet.    NOTE: Intermittent nausea, with or without vomiting, is a common side effect following surgery with intravenous sedation.  If this occurs, DO NOT eat or drink anything until it has subsided.  If the nausea or vomiting persists for more than 24 hours contact your physician or go to the nearest emergency room.    ACTIVITY: You may gradually return to your usual activity if not restricted by your surgeon. Sedative medications may linger in the body for 24-48 hours.  It is not uncommon to feel somewhat drowsy and/or dizzy the remainder of the day following surgery.  Avoid bending over as this may enhance dizziness.  Do not drive an automobile or operate any heavy machinery for 24 hours.  Do not make sudden movements when changing position, such as from a reclining to upright position.  Do not sign any legal documents or make any important decisions for 24 hours. Do not drink alcohol for 24 hours.    ADDITIONAL INSTRUCTIONS: It is not uncommon to tire easily and/or feel exhausted the day following surgery.  Do not be alarmed as this is normal.  Plan activities that will allow for rest periods as needed.    For your safety, we strongly advise that a responsible adult remain with you for the rest of thesurgical day and night.  Also, you should not be responsible for the care of others  (children/adults) for 24 hours post anesthesia and a responsible adult should be present to assume these responsibilities.    You should take several deep breaths and give several forceful coughs every  hour during the initial post-operative day.  This will help to clear mucus and keep the lungs appropriately inflated.    If you need advice or have any questions regarding anesthesia, please call the appropriate number and ask to speak to an available anesthesiologist:    [phone] : Tzzxijbd until 5pm, [phone]: After-hours or Weekends/Holidays  (This is the [facility]; ask to have the faculty anesthesiologist paged)

## 2024-09-07 NOTE — Procedures
 UPPER GI ENDOSCOPY Procedure Report    PATIENT NAME: [name] [name] DATE/TIME OF PROCEDURE: 09/07/2024 / 02:30 PM    DATE OF BIRTH: Aug 01, 1971 ENDOSCOPIST: Morene Skinner, MD    RECORD NUMBER: [id number] REFERRING PHYSICIAN:  FELLOW: ,    INDICATION FOR EXAMINATION: LUQ pain, dysphagia, history of gastric IM  PROCEDURE PERFORMED : UPPER GI ENDOSCOPY - biopsy    MEDICATIONS: MAC Anesthesia    PROCEDURE TECHNIQUE:  Patient's medications, allergies, past medical, surgical, social and family histories were reviewed and updated as  appropriate. A discussion of informed consent was had with the patient and/or the patient's family prior to the  procedure, including sedation. The alternatives, benefits and risks of the procedure including but not limited to  perforation, hemorrhage, infection, adverse drug reaction and aspiration were discussed  Procedure Details:  Informed consent was obtained for the procedure, including sedation. Risks of perforation, hemorrhage,  infection, adverse drug reaction and aspiration were discussed. The patient was placed in position. Based on the  pre-procedure assessment, including review of the patient's medical history, medications, allergies, and review of  systems, the patient had been deemed to be an appropriate candidate for conscious sedation; the patient was  therefore sedated with the medications listed. The patient was monitored continuously with pulse oximetry, blood  pressure monitoring, and direct observations.  The [id number] #[id number] was introduced and passed without difficulty to second part of duodenum. A careful  inspection was made as the endoscope was withdrawn.  Findings and interventions are described below:    EXTENT OF EXAM: second part of duodenum    INSTRUMENTS: (E - 5) [id number]    TECHNICALLY DIFFICULT EXAM: No    LIMITATIONS: None TOLERANCE: Good VISUALIZATION: Good    FINDINGS:  Esophagus: There was a 2cm hiatal hernia. Biopsies taken in proximal, distal esophagus.  Stomach: Normal. Cold forceps biopsies were taken according to [facility] Gastric Mapping Biopsy protocol: 2  fundus, 4 body lesser curve, 2 angularis, 3 antrum lesser curve, 3 antrum greater curve, 6 body greater curve.  Duodenum: Normal.    DIAGNOSIS:  Small hiatal hernia  Otherwise normal EGD s/p esophageal biopsies and gastric mapping  UPPER GI ENDOSCOPY Procedure Report    RECOMMENDATIONS:  Follow up pathology  This note was electronically signed on 09/07/2024, 08:29:31 PM by Morene Skinner, MD

## 2024-09-07 NOTE — H&P
 Procedure: [x]  EGD  []  Colonoscopy  []  PEG  []  Enteroscopy  []  Flexible Sigmoidoscopy                     []  Other:    Level of sedation intended for procedure: []  Moderate  [x]  MAC  []  Anesthesia    Informed Consent Obtained Including Risks, Benefits & Alternatives:  [x]  Yes    Informed Consent Obtained For Sedation Including Risks, Benefits & Alternatives: [x]  Yes    History:    Chief Complaint / Indication:  LUQ pain, dysphagia, mapping     Allergies:   Allergies   Allergen Reactions    Lisinopril Other (See Comments)     Headache x 5 months with medication    Hydrochlorothiazide        Current Medication: Medication record reviewed  Medications that the patient states to be currently taking   Medication Sig    omeprazole  20 mg DR capsule Take 1 capsule (20 mg total) by mouth daily.     Current Facility-Administered Medications   Medication Dose Route Frequency    sodium chloride  0.9% IV soln  5 mL/hr Intravenous Continuous       Past Medical History: History reviewed. No pertinent past medical history.     Family History: family history is not on file.    Social History:  reports that she has never smoked. She has never used smokeless tobacco. She reports that she does not drink alcohol and does not use drugs.    Past Surgical History: History reviewed. No pertinent surgical history.     Physical Exam:    Vitals Signs:   Last Recorded Vital Signs:    09/07/24 1414   BP: 138/77   Pulse: 76   Resp: 12   Temp:    SpO2: 100%       Body mass index is 29.69 kg/m?[name] [name]  Vitals:    09/07/24 1335   Weight: 152 lb (68.9 kg)   Height: 5' (1.524 m)         Normal Exam                                     Additional Findings  [x]  General  [x]  HEENT  [x]  Heart / CVS  [x]  Lungs / Respiratory  [x]  Abdomen      Airway Assessment:    [place] Visualized: []  Yes  []  Partially  []  Not at all  Neck ROM: [x]  Normal  []  Limited  Sleep apnea confirmed by sleep study or on CPAP at home: [x]  No  []  Yes      ASA Classification: ASA 2 - Patient with mild systemic disease with no functional limitations    I have reviewed the history and physical and have determined [name] [name] to be an appropriate candidate to undergo the planned procedure with sedation and analgesia.

## 2024-09-10 LAB — Tissue Exam

## 2024-09-25 ENCOUNTER — Ambulatory Visit: Payer: PRIVATE HEALTH INSURANCE | Attending: Student in an Organized Health Care Education/Training Program

## 2024-09-25 NOTE — Progress Notes
 Outpatient Hepatology Follow up      PATIENT: [name] [name]  MRN: [id number]  DOB: 1971-06-30  DATE OF SERVICE: 09/25/2024  REFERRING PRACTITIONER: Shannon Morene FALCON., MD  PRIMARY CARE PROVIDER: Rojter, Unice BRAVO, MD  REASON FOR REFERRAL: MASLD  CHIEF COMPLAINT:  MASLD    Subjective:      Lener Ventresca is a 53 y.o. 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 Pylori    The patient was previously seen by Dr. Unice Rojter. As detailed in note, '' In around 2020/2021, patient underwent abdominal ultrasound and was told had fatty liver. Work up as detailed below:     10/05/2021: ''AB US : fatty liver, no liver lesions, spleen 9.7cm''  02/12/2022:  AST 93, ALT 121, ALP 112  08/31/2022: PLT 215, AST 84, ALT 69, ALP 164, Tb 0.8, ceruloplasmin 37, Hepatitis B surface Ag positive, Hepatitis B core Ab positive, HCV Ab negative, A1AT MM  10/04/2022 Fibroscan 22.8kPA, CAP 386  10/24/2022 AB US : fatty liver, no hepatic lesions, spleen 9.7cm, no ascites   11/09/2022: AST 51, ALT 46, ALP 172, Tb 0.8, Hepatitis B e Ag negative, HBV PCR negative, HDV Ab positive   Met with Dr. Geraldean to complete HDV PCR labs but I do not have the results.   OSH hospitalization 4/15-4/20/2024 for left sided abdominal pain and left flank pain associated with watery diarrhea. Underwent CT ab pelvis which described prominent appendix and thickening of cecum. Also underwent an EGD but do not have the results. Notes mention gastritis and gastric polyp  Weight was 152lbs  on 01/04/2023 and now 145lbs.   Met with Dr. Nulsen 04/25/2023 for abdominal pain   Started Hepatitis B vaccination with PCP and is due for second dose in October 2024.   For arthritis and eyes was started on prednisone and plaquenil for Sjogren. Prednisone eventually tapered off    Interval events  Seeing Gastroenterologist Dr. Nulsen.   Was hospitalized at [facility] in 08/2024 for gastroenteritis   Presents with daughter. Making some efforts toward weight loss.       Meal recall:   Cooks at home    Exercise: walking 40 minutes daily     Alcohol: does not consume alcohol     Supplements: none    Family History  Brother: liver disease  Uncle: died from liver disease    Social History     Socioeconomic History    Marital status: Married   Tobacco Use    Smoking status: Never    Smokeless tobacco: Never   Substance and Sexual Activity    Alcohol use: Never    Drug use: Never     Social Drivers of Health     Physical Activity: At Risk (02/21/2024)    Received from [facility]    Physical Activity     Weekly Physical Activity: 2   Stress: Not at Risk (02/21/2024)    Received from [facility]    Stress     Do you feel these kinds of stress these days?: 1   Financial Resource Strain: Not at Risk (02/21/2024)    Received from [employer]     How hard is it for you to pay for the very basics like food, housing, heating, medical care, and medications?: 1     Outpatient Medications Prior to Visit   Medication Sig    amLODIPine 5 mg tablet Take 1 tablet (5 mg total) by mouth.  aspirin 81 mg EC tablet Take 1 tablet (81 mg total) by mouth daily.    atorvastatin 20 mg tablet Take 1 tablet (20 mg total) by mouth.    hydroxychloroquine 200 mg tablet Take 1 tablet (200 mg total) by mouth daily.    losartan 100 mg tablet Take 1 tablet (100 mg total) by mouth.    LUMIGAN 0.01 % ophthalmic solution Place 1 drop into the right eye.    omeprazole  20 mg DR capsule Take 1 capsule (20 mg total) by mouth daily.    pilocarpine 5 mg tablet Take 1 tablet (5 mg total) by mouth three (3) times daily.    prednisoLONE acetate 1% ophthalmic suspension Place 1 drop into both eyes three (3) times daily.    Resmetirom  (REZDIFFRA ) 60 MG TABS Take 60 mg by mouth daily.    RESTASIS 0.05 % ophthalmic emulsion Place 1 drop into both eyes two (2) times daily.    loratadine 10 mg tablet Take 1 tablet (10 mg total) by mouth. (Patient not taking: Reported on 09/25/2024)    pantoprazole 40 mg DR tablet Take 1 tablet (40 mg total) by mouth daily. (Patient not taking: Reported on 09/25/2024)    polyethylene glycol powder Take 17 g by mouth daily. (Patient not taking: Reported on 09/25/2024)     No facility-administered medications prior to visit.     Allergies   Allergen Reactions    Lisinopril Other (See Comments)     Headache x 5 months with medication    Hydrochlorothiazide        Review of Systems:   14 point Complete ROS is negative except for above      Objective:      BP 143/78 (BP Location: Left arm, Patient Position: Sitting, Cuff Size: Regular)  ~ Pulse 65  ~ Temp 36.3 ?C (97.4 ?F) (Temporal)  ~ Ht 5' 1'' (1.549 m)  ~ Wt 150 lb (68 kg)  ~ SpO2 100%  ~ BMI 28.34 kg/m?   Gen: NAD, AAO4  HEENT: no scleral icterus  AB: central fat deposition, nontender, nondistended, no hepatomegaly  Ext: no peripheral edema  MSK: well nourished  SKIN: no jaundice    Lab Review:   02/12/2022  AST 93, ALT 121, ALP 112, Tb 0.8    08/31/2022  PLT 215  AST 84, ALT 69, ALP 164, Tb 0.8  Hepatitis A Ab reactive  Hepatitis B surface Ag reactive   Hepatitis B core AB reactive  HCV AB negative  TS 19%  SMA negative  Ceruloplasmin 37    11/09/2022  PLT 157  Creatinine 0.64, AST 51, ALT 46, ALP 172, Tb 0.8  Hepatitis B e Ag negative, Hepatitis B e AB nonreactive  HBV PCR negative   Hepatitis Delta Ab positive   HIV negative  AFP L3 10.3%    01/04/2023  Hepatitis B core Ab negative  AMA <1:20    01/14/2023  WBC 3.5, Hb 11.2, PLT 216  AST 40, ALT 34, ALP 157, Tb 0.7  INR 0.99    01/15/2023  AST 40, ALT 36, ALP 162, Tb 0.8    01/17/2023  Lipase 74  Hepatitis B surface Ag negative  Hepatitis B e Ag negative  Hepatitis B eAb negative  HBV PCR negative   Hepatitis Delta IgM abnormal   HCV AB negative  AMA negative  TS 13%    01/19/2023  Na 138, creatinine 0.7    02/27/2023  PLT 197  AST 76, ALT 64,  ALP 214, Tb 0.7  Hepatitis B core Ab negative  Hepatitis B surface Ab negative  HBV PCR negative    05/24/2023  AST 45, ALT 34, ALP 195, Tb 0.6    07/25/2023  PLT 190  AST 26, ALT 23, ALP 150, Tb 0.8  INR 1.1  A1c 5.7    12/17/2023  AST 36 ALT 26 ALP 159 Tb 0.6    03/24/2024  AST 40 ALT 39 ALP 160 Tb 0.7    08/14/2024  AST 42 ALT 38 ALP 123 Tb 0.8    -----------------------------------------------------------------------  Imaging notable for:   AB US  10/05/2021  Hyperechoic and heterogenous echotexture of the liver. No intrahepatic lesions  Spleen is 9.7cm  Impression  Fatty infiltration of the liver     Fibroscan 10/04/2022  CAP 286, 22.8kPA    AB US  10/24/2022  Hyperechoic and heterogenous echotexture of the liver. No intrahepatic lesions   Spleen is normal at 9.7cm  Impression:   Fatty infiltration of the liver     CT ab pelvis 01/14/2023  Prominent appendix without inflammatory changes. Acute appendicitis not excluded.   Thickening of the cecum through hepatic flexure without associated inflammatory changes. May represent colitis.     AB US  01/15/2023  Liver demonstrates heterogenous attenuating echotexture spanning 15.1cm in greatest long axis dimensions. There is no intrahepatic biliary dilation. The portal vein is patent with normal directional flow. The common duct measures 3mm within normal limits. The gallbladder is unremarkable. Limited evaluation of the pancreas, IVC, aorta  No ascites.     CT ab pelvis 06/20/2023  1.  No definite CT findings to explain abdominal pain/weight loss.  2.  Mildly irregular hepatic surface contour, likely reflects chronic liver disease. No focal liver lesion. Small perisplenic collaterals, but otherwise no signs of portal hypertension.  3.  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 The [employer] of Gastroenterology Guidelines recommend contrast enhanced MRCP in one year to ensure stability.    MRI 12/03/2023  1. Mild hepatic steatosis with evidence of mild fibrosis. No focal liver mass or evidence of portal hypertension.  2. 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 The [employer] of Gastroenterology Guidelines recommend contrast enhanced MRCP in 1-2 years to ensure   stability.*     Fibroscan 01/15/2024  Interpretation:  This Liver Stiffness Score is suggestive of:  -  F2 Fibrosis  Interpretation: The CAP Score is suggestive of:   -  S2 Steatosis    --------------------------------------------------------------------------------  Procedure  EGD 01/17/2023           EGD 09/07/2024  Esophagus: There was a 2cm hiatal hernia. Biopsies taken in proximal, distal esophagus.  Stomach: Normal. Cold forceps biopsies were taken according to [facility] Gastric Mapping Biopsy protocol: 2  fundus, 4 body lesser curve, 2 angularis, 3 antrum lesser curve, 3 antrum greater curve, 6 body greater curve.  Duodenum: Normal.     DIAGNOSIS:  Small hiatal hernia  Otherwise normal EGD s/p esophageal biopsies and gastric mapping  UPPER GI ENDOSCOPY Procedure Report    Assessment & Plan:   Ashleyanne Hemmingway is a 53 y.o. 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 These are improving.     While there was an initial concern for Hepatitis B  based on serologies 08/2022 and query of Hepatitis D, it appears that repeat labs by 12/2022 was not consistent with Hepatitis B, and as such making Hepatitis D unlikely. Unclear if she had a transient Hepatitis B exposure.     OSH Fibroscan 10/2022 notable for 22.8kPA for CAP286 raising concern for cirrhosis. Fibroscan 11/2023 with F2/S2. On Resmetirom     Etiology is likely related to MASLD    Recommend lifestyle modifications including avoidance of alcohol, diet and exercise with goal toward weight reduction of at least 10% of body weight to observe improvement in liver tests and any steatosis or fibrosis.   Continue to monitor liver chemistry tests over time  Maintain HCC screening until fibrosis state is clarified and consistent  On Resmetirom   Planned for MRI elastography but insurance denied.   Can plan for nonurgent Fibroscan again in 2026  Retrieve records for MRI from OSH  Referral to Clinical Nutrition or through PCP  Avoid alcohol   Avoid herbal supplement    # Pancreas cyst  Small and noted on CT ab pelvis  Monitor with MRI annually. Retrieve records for MRI from OSH    Follow up: 3-4 months     30 minutes were spent personally by me today on this encounter which include today's pre-visit review of the chart, obtaining appropriate history, performing an evaluation, documentation and discussion of management with details supported within the note for today's visit. The time documented was exclusive of any time spent on any separately billed procedure.  Thank you for the opportunity to participate in the care of this patient. Please feel free to contact me with any questions or concerns.      Sharlyne Pay, MD  Transplant Hepatologist  [facility], [facility], [place]

## 2024-09-25 NOTE — Patient Instructions
 It was a pleasure meeting with you today.   Labs prior to appointment  Retrieve records of [facility] MRI  We discussed Fibroscan which is not urgent. Please schedule Fibroscan at [facility] clinic, [phone]. This requires 3-4 hours of fasting prior to. It is not appropriate if you may be pregnant, or if you have a pacemaker or defibrillator.   Avoid alcohol  Avoid herbal supplements  Tylenol can be used for pain or fevers but should not exceed 2000mg  per day  Avoid ibuprofen, advil, aleve, excedrin, motrin as this can increase the risk of stomach ulcers and gastrointestinal bleeding  Please note that we will make our best effort to contact you by phone regarding urgent concerns and critical diagnostic testing  [facility] messaging is intended for the use of basic and simple correspondence pertaining to non urgent matters.   If you have complex concerns or multiple questions, please set up an appointment to review  If you have an inquiry, please provide our staff with a detailed message regarding your question and best way to contact you.     If you choose to obtain diagnostic testing outside of [facility], it is your responsibility to communicate with the office about forwarding your results.    If your treatment plans includes imaging including a CT ab pelvis or MRI, please confirm any requirements including necessary blood work with radiology at the time of scheduling and notify your provider if updated labs will be needed.   Please reach out to your primary care provider for matters that are unrelated to liver disease
